# Patient Record
Sex: Male | Born: 1960 | Race: Black or African American | Hispanic: No | Marital: Married | Smoking: Former smoker
Health system: Southern US, Community
[De-identification: ages and names within clinical notes are randomized; demographics above are authoritative.]

## PROBLEM LIST (undated history)

## (undated) DIAGNOSIS — I255 Ischemic cardiomyopathy: Secondary | ICD-10-CM

## (undated) DIAGNOSIS — D649 Anemia, unspecified: Secondary | ICD-10-CM

## (undated) DIAGNOSIS — I10 Essential (primary) hypertension: Secondary | ICD-10-CM

## (undated) DIAGNOSIS — N183 Chronic kidney disease, stage 3 unspecified: Secondary | ICD-10-CM

## (undated) DIAGNOSIS — I219 Acute myocardial infarction, unspecified: Secondary | ICD-10-CM

## (undated) DIAGNOSIS — I2510 Atherosclerotic heart disease of native coronary artery without angina pectoris: Secondary | ICD-10-CM

## (undated) DIAGNOSIS — E1121 Type 2 diabetes mellitus with diabetic nephropathy: Secondary | ICD-10-CM

## (undated) DIAGNOSIS — E785 Hyperlipidemia, unspecified: Secondary | ICD-10-CM

## (undated) HISTORY — DX: Ischemic cardiomyopathy: I25.5

## (undated) HISTORY — DX: Chronic kidney disease, stage 3 unspecified: N18.30

## (undated) HISTORY — PX: BACK SURGERY: SHX140

## (undated) HISTORY — DX: Type 2 diabetes mellitus with diabetic nephropathy: E11.21

---

## 2001-02-10 ENCOUNTER — Emergency Department (HOSPITAL_COMMUNITY): Admission: EM | Admit: 2001-02-10 | Discharge: 2001-02-10 | Payer: Self-pay | Admitting: Emergency Medicine

## 2001-05-08 ENCOUNTER — Emergency Department (HOSPITAL_COMMUNITY): Admission: EM | Admit: 2001-05-08 | Discharge: 2001-05-08 | Payer: Self-pay | Admitting: Emergency Medicine

## 2001-05-08 ENCOUNTER — Encounter: Payer: Self-pay | Admitting: Emergency Medicine

## 2002-02-16 ENCOUNTER — Encounter: Payer: Self-pay | Admitting: Specialist

## 2002-02-16 ENCOUNTER — Ambulatory Visit (HOSPITAL_COMMUNITY): Admission: RE | Admit: 2002-02-16 | Discharge: 2002-02-16 | Payer: Self-pay | Admitting: Specialist

## 2007-12-25 HISTORY — PX: ROTATOR CUFF REPAIR: SHX139

## 2010-01-13 ENCOUNTER — Encounter (INDEPENDENT_AMBULATORY_CARE_PROVIDER_SITE_OTHER): Payer: Self-pay | Admitting: Internal Medicine

## 2010-01-13 ENCOUNTER — Inpatient Hospital Stay (HOSPITAL_COMMUNITY): Admission: EM | Admit: 2010-01-13 | Discharge: 2010-01-14 | Payer: Self-pay | Admitting: Emergency Medicine

## 2010-01-23 ENCOUNTER — Inpatient Hospital Stay (HOSPITAL_COMMUNITY): Admission: EM | Admit: 2010-01-23 | Discharge: 2010-01-27 | Payer: Self-pay | Admitting: Emergency Medicine

## 2010-02-21 HISTORY — PX: CORONARY ARTERY BYPASS GRAFT: SHX141

## 2010-04-28 ENCOUNTER — Inpatient Hospital Stay (HOSPITAL_COMMUNITY): Admission: EM | Admit: 2010-04-28 | Discharge: 2010-05-08 | Payer: Self-pay | Admitting: Cardiology

## 2010-04-28 ENCOUNTER — Inpatient Hospital Stay (HOSPITAL_BASED_OUTPATIENT_CLINIC_OR_DEPARTMENT_OTHER): Admission: RE | Admit: 2010-04-28 | Discharge: 2010-04-28 | Payer: Self-pay | Admitting: Cardiology

## 2010-04-28 ENCOUNTER — Ambulatory Visit: Payer: Self-pay | Admitting: Cardiothoracic Surgery

## 2010-04-29 ENCOUNTER — Encounter (INDEPENDENT_AMBULATORY_CARE_PROVIDER_SITE_OTHER): Payer: Self-pay | Admitting: Cardiology

## 2010-05-29 ENCOUNTER — Encounter: Admission: RE | Admit: 2010-05-29 | Discharge: 2010-05-29 | Payer: Self-pay | Admitting: *Deleted

## 2010-05-29 ENCOUNTER — Ambulatory Visit: Payer: Self-pay | Admitting: Cardiothoracic Surgery

## 2010-05-31 ENCOUNTER — Ambulatory Visit: Payer: Self-pay | Admitting: Cardiothoracic Surgery

## 2010-06-28 ENCOUNTER — Ambulatory Visit: Payer: Self-pay | Admitting: Cardiothoracic Surgery

## 2010-08-16 ENCOUNTER — Ambulatory Visit: Payer: Self-pay | Admitting: Cardiothoracic Surgery

## 2010-08-16 ENCOUNTER — Encounter: Admission: RE | Admit: 2010-08-16 | Discharge: 2010-08-16 | Payer: Self-pay | Admitting: Cardiothoracic Surgery

## 2010-11-01 ENCOUNTER — Ambulatory Visit: Payer: Self-pay | Admitting: Cardiothoracic Surgery

## 2010-11-15 ENCOUNTER — Encounter: Admission: RE | Admit: 2010-11-15 | Discharge: 2010-11-15 | Payer: Self-pay | Admitting: Cardiothoracic Surgery

## 2010-11-24 ENCOUNTER — Ambulatory Visit: Payer: Self-pay | Admitting: Cardiothoracic Surgery

## 2011-02-27 ENCOUNTER — Encounter (HOSPITAL_COMMUNITY)
Admission: RE | Admit: 2011-02-27 | Discharge: 2011-02-27 | Disposition: A | Discharge status: Home / Self Care | Payer: Federal, State, Local not specified - PPO | Source: Ambulatory Visit | Attending: Neurosurgery | Admitting: Neurosurgery

## 2011-02-27 DIAGNOSIS — Z01812 Encounter for preprocedural laboratory examination: Secondary | ICD-10-CM | POA: Insufficient documentation

## 2011-02-27 LAB — BASIC METABOLIC PANEL
BUN: 17 mg/dL (ref 6–23)
Calcium: 9.6 mg/dL (ref 8.4–10.5)
Chloride: 103 mEq/L (ref 96–112)
Creatinine, Ser: 1.4 mg/dL (ref 0.4–1.5)
GFR calc Af Amer: >60 mL/min (ref >60)
GFR calc non Af Amer: 54 mL/min — ABNORMAL LOW (ref >60)

## 2011-02-27 LAB — CBC
HCT: 39.1 % (ref 39.0–52.0)
MCH: 33.4 pg (ref 26.0–34.0)
Platelets: 211 10*3/uL (ref 150–400)
RDW: 13.1 % (ref 11.5–15.5)
WBC: 6.8 10*3/uL (ref 4.0–10.5)

## 2011-02-27 LAB — SURGICAL PCR SCREEN: Staphylococcus aureus: NEGATIVE

## 2011-03-01 ENCOUNTER — Inpatient Hospital Stay (HOSPITAL_COMMUNITY)
Admission: RE | Admit: 2011-03-01 | Payer: Federal, State, Local not specified - PPO | Source: Ambulatory Visit | Admitting: Neurosurgery

## 2011-03-12 LAB — POCT CARDIAC MARKERS
CKMB, poc: 5.5 ng/mL (ref 1.0–8.0)
Troponin i, poc: <0.05 ng/mL (ref 0.00–0.09)
Troponin i, poc: <0.05 ng/mL (ref 0.00–0.09)

## 2011-03-12 LAB — DIFFERENTIAL
Basophils Relative: 0 % (ref 0–1)
Basophils Relative: 0 % (ref 0–1)
Eosinophils Absolute: 0 10*3/uL (ref 0.0–0.7)
Eosinophils Absolute: 0.2 10*3/uL (ref 0.0–0.7)
Lymphs Abs: 0.8 10*3/uL (ref 0.7–4.0)
Lymphs Abs: 1.3 10*3/uL (ref 0.7–4.0)
Monocytes Absolute: 0.3 10*3/uL (ref 0.1–1.0)
Monocytes Absolute: 0.6 10*3/uL (ref 0.1–1.0)
Monocytes Relative: 10 % (ref 3–12)
Monocytes Relative: 2 % — ABNORMAL LOW (ref 3–12)
Neutrophils Relative %: 93 % — ABNORMAL HIGH (ref 43–77)

## 2011-03-12 LAB — POCT I-STAT, CHEM 8
BUN: 14 mg/dL (ref 6–23)
BUN: 9 mg/dL (ref 6–23)
Calcium, Ion: 1.16 mmol/L (ref 1.12–1.32)
Chloride: 110 mEq/L (ref 96–112)
Creatinine, Ser: 1.1 mg/dL (ref 0.4–1.5)
HCT: 49 % (ref 39.0–52.0)
Sodium: 141 mEq/L (ref 135–145)
Sodium: 142 mEq/L (ref 135–145)
TCO2: 26 mmol/L (ref 0–100)

## 2011-03-12 LAB — BASIC METABOLIC PANEL
Calcium: 9 mg/dL (ref 8.4–10.5)
GFR calc Af Amer: >60 mL/min (ref >60)
GFR calc non Af Amer: >60 mL/min (ref >60)
Glucose, Bld: 133 mg/dL — ABNORMAL HIGH (ref 70–99)
Sodium: 140 mEq/L (ref 135–145)

## 2011-03-12 LAB — URINALYSIS, ROUTINE W REFLEX MICROSCOPIC
Glucose, UA: 250 mg/dL — AB
Nitrite: NEGATIVE
Protein, ur: NEGATIVE mg/dL
Specific Gravity, Urine: 1.02 (ref 1.005–1.030)
Urobilinogen, UA: 0.2 mg/dL (ref 0.0–1.0)

## 2011-03-12 LAB — CK TOTAL AND CKMB (NOT AT ARMC)
CK, MB: 2.3 ng/mL (ref 0.3–4.0)
CK, MB: 30.5 ng/mL (ref 0.3–4.0)
Relative Index: 6.7 — ABNORMAL HIGH (ref 0.0–2.5)

## 2011-03-12 LAB — COMPREHENSIVE METABOLIC PANEL
ALT: 87 U/L — ABNORMAL HIGH (ref 0–53)
Alkaline Phosphatase: 57 U/L (ref 39–117)
CO2: 22 mEq/L (ref 19–32)
GFR calc non Af Amer: >60 mL/min (ref >60)
Glucose, Bld: 74 mg/dL (ref 70–99)
Potassium: 4.4 mEq/L (ref 3.5–5.1)
Sodium: 139 mEq/L (ref 135–145)
Total Protein: 7.6 g/dL (ref 6.0–8.3)

## 2011-03-12 LAB — CBC
HCT: 40.1 % (ref 39.0–52.0)
HCT: 45.2 % (ref 39.0–52.0)
Hemoglobin: 15.2 g/dL (ref 13.0–17.0)
MCHC: 33.6 g/dL (ref 30.0–36.0)
MCHC: 34.3 g/dL (ref 30.0–36.0)
MCV: 98 fL (ref 78.0–100.0)
MCV: 99.6 fL (ref 78.0–100.0)
MCV: 97.7 fL (ref 78.0–100.0)
Platelets: 178 10*3/uL (ref 150–400)
RBC: 3.02 MIL/uL — ABNORMAL LOW (ref 4.22–5.81)
WBC: 14.8 10*3/uL — ABNORMAL HIGH (ref 4.0–10.5)
WBC: 11.7 10*3/uL — ABNORMAL HIGH (ref 4.0–10.5)

## 2011-03-12 LAB — TSH: TSH: 0.584 u[IU]/mL (ref 0.350–4.500)

## 2011-03-12 LAB — HEMOGLOBIN A1C: Hgb A1c MFr Bld: 6.6 % — ABNORMAL HIGH (ref 4.6–6.1)

## 2011-03-12 LAB — CARDIAC PANEL(CRET KIN+CKTOT+MB+TROPI)
CK, MB: 2.8 ng/mL (ref 0.3–4.0)
Relative Index: 1.5 (ref 0.0–2.5)
Total CK: 103 U/L (ref 7–232)
Total CK: 311 U/L — ABNORMAL HIGH (ref 7–232)

## 2011-03-12 LAB — LIPID PANEL
Cholesterol: 175 mg/dL (ref 0–200)
LDL Cholesterol: 114 mg/dL — ABNORMAL HIGH (ref 0–99)
Total CHOL/HDL Ratio: 6.7 RATIO

## 2011-03-12 LAB — TROPONIN I: Troponin I: 0.05 ng/mL (ref 0.00–0.06)

## 2011-03-12 LAB — PROTIME-INR: INR: 0.98 (ref 0.00–1.49)

## 2011-03-13 LAB — BASIC METABOLIC PANEL
BUN: 10 mg/dL (ref 6–23)
BUN: 9 mg/dL (ref 6–23)
BUN: 9 mg/dL (ref 6–23)
CO2: 22 mEq/L (ref 19–32)
CO2: 27 mEq/L (ref 19–32)
Calcium: 7.6 mg/dL — ABNORMAL LOW (ref 8.4–10.5)
Calcium: 8.2 mg/dL — ABNORMAL LOW (ref 8.4–10.5)
Chloride: 107 mEq/L (ref 96–112)
Chloride: 107 mEq/L (ref 96–112)
Chloride: 115 mEq/L — ABNORMAL HIGH (ref 96–112)
Creatinine, Ser: 1.06 mg/dL (ref 0.4–1.5)
Creatinine, Ser: 1.09 mg/dL (ref 0.4–1.5)
GFR calc Af Amer: >60 mL/min (ref >60)
GFR calc Af Amer: >60 mL/min (ref >60)
GFR calc non Af Amer: >60 mL/min (ref >60)
GFR calc non Af Amer: >60 mL/min (ref >60)
GFR calc non Af Amer: >60 mL/min (ref >60)
Glucose, Bld: 119 mg/dL — ABNORMAL HIGH (ref 70–99)
Glucose, Bld: 139 mg/dL — ABNORMAL HIGH (ref 70–99)
Glucose, Bld: 84 mg/dL (ref 70–99)
Potassium: 3.9 mEq/L (ref 3.5–5.1)
Potassium: 3.9 mEq/L (ref 3.5–5.1)
Potassium: 4 mEq/L (ref 3.5–5.1)
Sodium: 138 mEq/L (ref 135–145)
Sodium: 138 mEq/L (ref 135–145)
Sodium: 142 mEq/L (ref 135–145)

## 2011-03-13 LAB — PROTIME-INR
INR: 1.22 (ref 0.00–1.49)
Prothrombin Time: 15.3 seconds — ABNORMAL HIGH (ref 11.6–15.2)

## 2011-03-13 LAB — CBC
HCT: 30.4 % — ABNORMAL LOW (ref 39.0–52.0)
HCT: 32.6 % — ABNORMAL LOW (ref 39.0–52.0)
HCT: 33.2 % — ABNORMAL LOW (ref 39.0–52.0)
HCT: 36.9 % — ABNORMAL LOW (ref 39.0–52.0)
HCT: 37.7 % — ABNORMAL LOW (ref 39.0–52.0)
HCT: 38.5 % — ABNORMAL LOW (ref 39.0–52.0)
HCT: 38.6 % — ABNORMAL LOW (ref 39.0–52.0)
HCT: 38.8 % — ABNORMAL LOW (ref 39.0–52.0)
HCT: 40.2 % (ref 39.0–52.0)
Hemoglobin: 10.4 g/dL — ABNORMAL LOW (ref 13.0–17.0)
Hemoglobin: 11.1 g/dL — ABNORMAL LOW (ref 13.0–17.0)
Hemoglobin: 11.4 g/dL — ABNORMAL LOW (ref 13.0–17.0)
Hemoglobin: 12.5 g/dL — ABNORMAL LOW (ref 13.0–17.0)
Hemoglobin: 12.9 g/dL — ABNORMAL LOW (ref 13.0–17.0)
Hemoglobin: 13.1 g/dL (ref 13.0–17.0)
Hemoglobin: 13.2 g/dL (ref 13.0–17.0)
Hemoglobin: 13.3 g/dL (ref 13.0–17.0)
Hemoglobin: 13.6 g/dL (ref 13.0–17.0)
Hemoglobin: 14.5 g/dL (ref 13.0–17.0)
MCHC: 33.6 g/dL (ref 30.0–36.0)
MCHC: 33.6 g/dL (ref 30.0–36.0)
MCHC: 33.7 g/dL (ref 30.0–36.0)
MCHC: 33.9 g/dL (ref 30.0–36.0)
MCHC: 34 g/dL (ref 30.0–36.0)
MCHC: 34.1 g/dL (ref 30.0–36.0)
MCHC: 34.2 g/dL (ref 30.0–36.0)
MCHC: 34.2 g/dL (ref 30.0–36.0)
MCHC: 34.5 g/dL (ref 30.0–36.0)
MCHC: 34.5 g/dL (ref 30.0–36.0)
MCV: 97.1 fL (ref 78.0–100.0)
MCV: 97.1 fL (ref 78.0–100.0)
MCV: 97.6 fL (ref 78.0–100.0)
MCV: 97.6 fL (ref 78.0–100.0)
MCV: 97.8 fL (ref 78.0–100.0)
MCV: 97.8 fL (ref 78.0–100.0)
MCV: 97.9 fL (ref 78.0–100.0)
MCV: 98.3 fL (ref 78.0–100.0)
MCV: 98.8 fL (ref 78.0–100.0)
MCV: 98.9 fL (ref 78.0–100.0)
Platelets: 139 10*3/uL — ABNORMAL LOW (ref 150–400)
Platelets: 144 10*3/uL — ABNORMAL LOW (ref 150–400)
Platelets: 145 10*3/uL — ABNORMAL LOW (ref 150–400)
Platelets: 145 10*3/uL — ABNORMAL LOW (ref 150–400)
Platelets: 146 10*3/uL — ABNORMAL LOW (ref 150–400)
Platelets: 79 10*3/uL — ABNORMAL LOW (ref 150–400)
Platelets: 84 10*3/uL — ABNORMAL LOW (ref 150–400)
Platelets: 85 10*3/uL — ABNORMAL LOW (ref 150–400)
Platelets: 89 10*3/uL — ABNORMAL LOW (ref 150–400)
Platelets: 94 10*3/uL — ABNORMAL LOW (ref 150–400)
RBC: 3.11 MIL/uL — ABNORMAL LOW (ref 4.22–5.81)
RBC: 3.29 MIL/uL — ABNORMAL LOW (ref 4.22–5.81)
RBC: 3.4 MIL/uL — ABNORMAL LOW (ref 4.22–5.81)
RBC: 3.73 MIL/uL — ABNORMAL LOW (ref 4.22–5.81)
RBC: 3.92 MIL/uL — ABNORMAL LOW (ref 4.22–5.81)
RBC: 3.95 MIL/uL — ABNORMAL LOW (ref 4.22–5.81)
RBC: 4.32 MIL/uL (ref 4.22–5.81)
RDW: 13.2 % (ref 11.5–15.5)
RDW: 13.3 % (ref 11.5–15.5)
RDW: 13.3 % (ref 11.5–15.5)
RDW: 13.3 % (ref 11.5–15.5)
RDW: 13.4 % (ref 11.5–15.5)
RDW: 13.4 % (ref 11.5–15.5)
RDW: 13.5 % (ref 11.5–15.5)
RDW: 13.6 % (ref 11.5–15.5)
RDW: 13.6 % (ref 11.5–15.5)
RDW: 13.7 % (ref 11.5–15.5)
WBC: 10.3 10*3/uL (ref 4.0–10.5)
WBC: 11.1 10*3/uL — ABNORMAL HIGH (ref 4.0–10.5)
WBC: 11.4 10*3/uL — ABNORMAL HIGH (ref 4.0–10.5)
WBC: 12.6 10*3/uL — ABNORMAL HIGH (ref 4.0–10.5)
WBC: 5.1 10*3/uL (ref 4.0–10.5)
WBC: 6.9 10*3/uL (ref 4.0–10.5)
WBC: 9.7 10*3/uL (ref 4.0–10.5)

## 2011-03-13 LAB — CREATININE, SERUM
Creatinine, Ser: 1.01 mg/dL (ref 0.4–1.5)
Creatinine, Ser: 1.26 mg/dL (ref 0.4–1.5)
GFR calc Af Amer: >60 mL/min (ref >60)
GFR calc Af Amer: >60 mL/min (ref >60)
GFR calc non Af Amer: >60 mL/min (ref >60)
GFR calc non Af Amer: >60 mL/min (ref >60)

## 2011-03-13 LAB — POCT I-STAT 4, (NA,K, GLUC, HGB,HCT)
Glucose, Bld: 113 mg/dL — ABNORMAL HIGH (ref 70–99)
Glucose, Bld: 118 mg/dL — ABNORMAL HIGH (ref 70–99)
Glucose, Bld: 88 mg/dL (ref 70–99)
HCT: 28 % — ABNORMAL LOW (ref 39.0–52.0)
HCT: 31 % — ABNORMAL LOW (ref 39.0–52.0)
HCT: 36 % — ABNORMAL LOW (ref 39.0–52.0)
Hemoglobin: 10.9 g/dL — ABNORMAL LOW (ref 13.0–17.0)
Hemoglobin: 12.2 g/dL — ABNORMAL LOW (ref 13.0–17.0)
Hemoglobin: 13.3 g/dL (ref 13.0–17.0)
Hemoglobin: 9.5 g/dL — ABNORMAL LOW (ref 13.0–17.0)
Potassium: 4 mEq/L (ref 3.5–5.1)
Potassium: 4.4 mEq/L (ref 3.5–5.1)
Potassium: 5.3 mEq/L — ABNORMAL HIGH (ref 3.5–5.1)
Potassium: 5.7 mEq/L — ABNORMAL HIGH (ref 3.5–5.1)
Sodium: 134 mEq/L — ABNORMAL LOW (ref 135–145)
Sodium: 138 mEq/L (ref 135–145)
Sodium: 139 mEq/L (ref 135–145)

## 2011-03-13 LAB — GLUCOSE, CAPILLARY
Glucose-Capillary: 127 mg/dL — ABNORMAL HIGH (ref 70–99)
Glucose-Capillary: 130 mg/dL — ABNORMAL HIGH (ref 70–99)
Glucose-Capillary: 132 mg/dL — ABNORMAL HIGH (ref 70–99)
Glucose-Capillary: 152 mg/dL — ABNORMAL HIGH (ref 70–99)
Glucose-Capillary: 99 mg/dL (ref 70–99)

## 2011-03-13 LAB — BLOOD GAS, ARTERIAL
FIO2: 0.21 %
O2 Saturation: 94.4 %
Patient temperature: 98.6
pH, Arterial: 7.415 (ref 7.350–7.450)

## 2011-03-13 LAB — URINE CULTURE
Colony Count: NO GROWTH
Culture: NO GROWTH
Special Requests: NEGATIVE

## 2011-03-13 LAB — POCT I-STAT 3, ART BLOOD GAS (G3+)
Acid-base deficit: 2 mmol/L (ref 0.0–2.0)
Acid-base deficit: 4 mmol/L — ABNORMAL HIGH (ref 0.0–2.0)
Acid-base deficit: 4 mmol/L — ABNORMAL HIGH (ref 0.0–2.0)
Bicarbonate: 20.8 mEq/L (ref 20.0–24.0)
Bicarbonate: 21.2 mEq/L (ref 20.0–24.0)
Bicarbonate: 22.6 mEq/L (ref 20.0–24.0)
Bicarbonate: 23.3 mEq/L (ref 20.0–24.0)
O2 Saturation: 96 %
Patient temperature: 38.6
Patient temperature: 39.1
TCO2: 22 mmol/L (ref 0–100)
TCO2: 24 mmol/L (ref 0–100)
pCO2 arterial: 39.5 mmHg (ref 35.0–45.0)
pCO2 arterial: 40.9 mmHg (ref 35.0–45.0)
pCO2 arterial: 42.5 mmHg (ref 35.0–45.0)
pH, Arterial: 7.31 — ABNORMAL LOW (ref 7.350–7.450)
pH, Arterial: 7.327 — ABNORMAL LOW (ref 7.350–7.450)
pH, Arterial: 7.334 — ABNORMAL LOW (ref 7.350–7.450)
pH, Arterial: 7.38 (ref 7.350–7.450)
pO2, Arterial: 100 mmHg (ref 80.0–100.0)
pO2, Arterial: 149 mmHg — ABNORMAL HIGH (ref 80.0–100.0)
pO2, Arterial: 247 mmHg — ABNORMAL HIGH (ref 80.0–100.0)

## 2011-03-13 LAB — URINALYSIS, MICROSCOPIC ONLY
Glucose, UA: NEGATIVE mg/dL
Hgb urine dipstick: NEGATIVE
Ketones, ur: NEGATIVE mg/dL
Protein, ur: NEGATIVE mg/dL
Urobilinogen, UA: 1 mg/dL (ref 0.0–1.0)

## 2011-03-13 LAB — CROSSMATCH

## 2011-03-13 LAB — URINALYSIS, ROUTINE W REFLEX MICROSCOPIC
Bilirubin Urine: NEGATIVE
Glucose, UA: NEGATIVE mg/dL
Hgb urine dipstick: NEGATIVE
Ketones, ur: NEGATIVE mg/dL
Nitrite: NEGATIVE
Protein, ur: NEGATIVE mg/dL
Specific Gravity, Urine: 1.035 — ABNORMAL HIGH (ref 1.005–1.030)
Urobilinogen, UA: 0.2 mg/dL (ref 0.0–1.0)
pH: 5 (ref 5.0–8.0)

## 2011-03-13 LAB — HEMOGLOBIN AND HEMATOCRIT, BLOOD
HCT: 30.4 % — ABNORMAL LOW (ref 39.0–52.0)
Hemoglobin: 10.6 g/dL — ABNORMAL LOW (ref 13.0–17.0)

## 2011-03-13 LAB — POCT I-STAT, CHEM 8
BUN: 10 mg/dL (ref 6–23)
Chloride: 113 mEq/L — ABNORMAL HIGH (ref 96–112)
Creatinine, Ser: 1 mg/dL (ref 0.4–1.5)
Hemoglobin: 13.3 g/dL (ref 13.0–17.0)
Potassium: 4.5 mEq/L (ref 3.5–5.1)
Sodium: 143 mEq/L (ref 135–145)

## 2011-03-13 LAB — COMPREHENSIVE METABOLIC PANEL
Alkaline Phosphatase: 51 U/L (ref 39–117)
BUN: 11 mg/dL (ref 6–23)
CO2: 24 mEq/L (ref 19–32)
Chloride: 108 mEq/L (ref 96–112)
Creatinine, Ser: 1.02 mg/dL (ref 0.4–1.5)
GFR calc non Af Amer: >60 mL/min (ref >60)
Glucose, Bld: 140 mg/dL — ABNORMAL HIGH (ref 70–99)
Total Bilirubin: 0.6 mg/dL (ref 0.3–1.2)

## 2011-03-13 LAB — HEPARIN LEVEL (UNFRACTIONATED): Heparin Unfractionated: 0.4 IU/mL (ref 0.30–0.70)

## 2011-03-13 LAB — POCT I-STAT GLUCOSE
Glucose, Bld: 90 mg/dL (ref 70–99)
Operator id: 198871

## 2011-03-13 LAB — MAGNESIUM
Magnesium: 2.2 mg/dL (ref 1.5–2.5)
Magnesium: 2.3 mg/dL (ref 1.5–2.5)
Magnesium: 3.1 mg/dL — ABNORMAL HIGH (ref 1.5–2.5)

## 2011-03-13 LAB — ABO/RH: ABO/RH(D): O POS

## 2011-03-13 LAB — APTT
aPTT: 33 seconds (ref 24–37)
aPTT: 66 seconds — ABNORMAL HIGH (ref 24–37)

## 2011-03-15 LAB — CBC
Hemoglobin: 12.1 g/dL — ABNORMAL LOW (ref 13.0–17.0)
Hemoglobin: 12.2 g/dL — ABNORMAL LOW (ref 13.0–17.0)
MCHC: 33.5 g/dL (ref 30.0–36.0)
MCHC: 34.2 g/dL (ref 30.0–36.0)
MCHC: 34.3 g/dL (ref 30.0–36.0)
MCV: 98 fL (ref 78.0–100.0)
MCV: 99 fL (ref 78.0–100.0)
MCV: 99.4 fL (ref 78.0–100.0)
Platelets: 164 10*3/uL (ref 150–400)
RBC: 3.56 MIL/uL — ABNORMAL LOW (ref 4.22–5.81)
RBC: 3.58 MIL/uL — ABNORMAL LOW (ref 4.22–5.81)
RBC: 3.74 MIL/uL — ABNORMAL LOW (ref 4.22–5.81)
RDW: 13.3 % (ref 11.5–15.5)
WBC: 8.9 10*3/uL (ref 4.0–10.5)
WBC: 9.2 10*3/uL (ref 4.0–10.5)

## 2011-03-15 LAB — LIPID PANEL
HDL: 30 mg/dL — ABNORMAL LOW (ref >39)
Total CHOL/HDL Ratio: 4.7 RATIO
VLDL: 17 mg/dL (ref 0–40)

## 2011-03-15 LAB — BASIC METABOLIC PANEL
BUN: 15 mg/dL (ref 6–23)
CO2: 23 mEq/L (ref 19–32)
Calcium: 8.4 mg/dL (ref 8.4–10.5)
Chloride: 108 mEq/L (ref 96–112)
Creatinine, Ser: 1.25 mg/dL (ref 0.4–1.5)
Glucose, Bld: 99 mg/dL (ref 70–99)

## 2011-03-15 LAB — CARDIAC PANEL(CRET KIN+CKTOT+MB+TROPI)
CK, MB: 131.9 ng/mL (ref 0.3–4.0)
Total CK: 1538 U/L — ABNORMAL HIGH (ref 7–232)

## 2011-04-13 ENCOUNTER — Encounter (HOSPITAL_COMMUNITY)
Admission: RE | Admit: 2011-04-13 | Discharge: 2011-04-13 | Disposition: A | Discharge status: Home / Self Care | Payer: Federal, State, Local not specified - PPO | Source: Ambulatory Visit | Attending: Neurosurgery | Admitting: Neurosurgery

## 2011-04-13 LAB — BASIC METABOLIC PANEL
Chloride: 107 mEq/L (ref 96–112)
GFR calc non Af Amer: >60 mL/min (ref >60)
Potassium: 4.5 mEq/L (ref 3.5–5.1)
Sodium: 136 mEq/L (ref 135–145)

## 2011-04-13 LAB — TYPE AND SCREEN
ABO/RH(D): O POS
Antibody Screen: NEGATIVE

## 2011-04-13 LAB — CBC
MCV: 93.7 fL (ref 78.0–100.0)
Platelets: 179 10*3/uL (ref 150–400)
RBC: 4.26 MIL/uL (ref 4.22–5.81)
RDW: 13.5 % (ref 11.5–15.5)
WBC: 6.4 10*3/uL (ref 4.0–10.5)

## 2011-04-13 LAB — SURGICAL PCR SCREEN
MRSA, PCR: NEGATIVE
Staphylococcus aureus: NEGATIVE

## 2011-04-19 ENCOUNTER — Inpatient Hospital Stay (HOSPITAL_COMMUNITY)
Admission: RE | Admit: 2011-04-19 | Discharge: 2011-04-22 | DRG: 756 | Disposition: A | Discharge status: Home / Self Care | Payer: Federal, State, Local not specified - PPO | Source: Ambulatory Visit | Attending: Neurosurgery | Admitting: Neurosurgery

## 2011-04-19 ENCOUNTER — Inpatient Hospital Stay (HOSPITAL_COMMUNITY): Payer: Federal, State, Local not specified - PPO

## 2011-04-19 DIAGNOSIS — Z01812 Encounter for preprocedural laboratory examination: Secondary | ICD-10-CM

## 2011-04-19 DIAGNOSIS — I10 Essential (primary) hypertension: Secondary | ICD-10-CM | POA: Diagnosis present

## 2011-04-19 DIAGNOSIS — Z951 Presence of aortocoronary bypass graft: Secondary | ICD-10-CM

## 2011-04-19 DIAGNOSIS — M5126 Other intervertebral disc displacement, lumbar region: Secondary | ICD-10-CM | POA: Diagnosis present

## 2011-04-19 DIAGNOSIS — M47817 Spondylosis without myelopathy or radiculopathy, lumbosacral region: Principal | ICD-10-CM | POA: Diagnosis present

## 2011-04-19 DIAGNOSIS — Z01810 Encounter for preprocedural cardiovascular examination: Secondary | ICD-10-CM

## 2011-04-19 DIAGNOSIS — I2510 Atherosclerotic heart disease of native coronary artery without angina pectoris: Secondary | ICD-10-CM | POA: Diagnosis present

## 2011-04-25 NOTE — Op Note (Signed)
NAMEZACKARIAH, VANDERPOL                  ACCOUNT NO.:  [id number]  MEDICAL RECORD NO.:  [id number]           PATIENT TYPE:  I  LOCATION:  3008                         FACILITY:  [facility]  PHYSICIAN:  Danae Orleans. Venetia Maxon, M.D.  DATE OF BIRTH:  1961/12/07  DATE OF PROCEDURE:  04/19/2011 DATE OF DISCHARGE:                              OPERATIVE REPORT   PREOPERATIVE DIAGNOSIS:  Herniated lumbar disk with stenosis, spondylosis, degenerative disk disease and radiculopathy L4-5, L5-S1 levels.  POSTOPERATIVE DIAGNOSIS:  Herniated lumbar disk with stenosis, spondylosis, degenerative disk disease and radiculopathy L4-5, L5-S1 levels.  PROCEDURE: 1. L4-5 and L5-S1 decompression and more involved than for simple     interbody fusion. 2. Transforaminal lumbar interbody fusion with PEEK interbody cages,     morselized bone autograft, PuraGen and allograft at the L4-5, L5-S1     levels. 3. Segmental instrumentation using pedicle screw fixation L4-S1 levels bilaterally. 4. Posterolateral arthrodesis L4-S1 levels.  SURGEON:  Danae Orleans. Venetia Maxon, MD  ASSISTANTS:  Georgiann Cocker, RN and Hilda Lias, MD  ANESTHESIA:  General endotracheal anesthesia.  ESTIMATED BLOOD LOSS:  200 mL.  COMPLICATIONS:  None.  DISPOSITION:  Recovery.  INDICATIONS:  Kwesi Sangha is a 50 year old man with severe lumbar spondylosis and marked foraminal stenosis of the L5-S1 level on the left and with L4-5 degeneration and spondylosis and stenosis with herniated disk also affecting the left-sided nerve root.  It was elected to take him to surgery for decompression and fusion at these affected levels.  PROCEDURE:  Mr. Keimig was brought to the operating room.  Following a satisfactory and uncomplicated induction of general endotracheal anesthesia plus intravenous lines, the patient was placed in a prone position on the Banning table.  His low back was shaved, then prepped and draped in usual sterile fashion.  Area of planned  incision was infiltrated with local lidocaine.  Incision was made in the midline and carried through to the lumbodorsal fascia which was incised bilaterally. Subperiosteal dissection was performed exposing L4-S1 transverse processes and sacral ala.  Self-retaining retractors were placed to facilitate exposure.  Intraoperative x-ray demonstrated marker probes at the L4, L5 and S1 levels.  A hemi laminectomy at L5-L4 was then performed on the left side of midline with thorough decompression of the L4, L5 and S1 nerve roots.  There was a large calcified disk herniation which was causing severe foraminal stenosis on the left and this was carefully removed and along with a large osteophyte the interspace was thoroughly decompressed and the L5 nerve root was decompressed as it extended out the neural foramen.  Similar decompression was then performed at L4-5 level.  There was also a calcified disk herniation at this level again with significant foraminal stenosis and thorough decompression was performed.  Contralateral distraction was then performed on the right with laminar spreaders, thorough endplate preparation was performed and at the L4-5 level 8 mL of bone autograft which had been moselized through a bone mill was tamped into the interspace and countersunk appropriately followed by an 11-mm PEEK interbody cage which was packed with PuraGen soaked in ProFuse blocks inserted and  countersunk appropriately.  Additional bone autograft was placed overlying the implant and tamped into position.  At the L5-S1 level, a similar decompression and preparation was performed.  There did not appear to be much distraction possible at this level and after trial sizing, a 6-mm TLIF cage was packed with ProFuse and additional approximately 4 mL of bone autograft was placed in the interspace and countersunk appropriately and the TLIF implant was then placed and countersunk.  Following this, pedicle screws  were placed using 45 x 6.5 mm screws at S1, 50 x 6.5-mm screws at L4-L5 bilaterally.  60-mm preloaded rod was fixed in the screw heads on the left and locked down in situ and 70-mm rod was placed on the right.  Prior to placing the final screws, thorough decortication was performed at the facet joints and facet joint complexes on posterolateral region on the right.  The remaining approximately 8 mL of bone autograft was placed and countersunk and tamped into position overlying the facet joint complexes in the posterolateral region and 10 mL of Vitoss bone which had been mixed with the patient's blood was then placed overlying this and tamped into position.  After the screws were locked down in situ, the self- retaining retractor was removed.  Neural elements were palpated and felt to be well-decompressed.  Hemostasis was assured.  The lumbodorsal fascia was closed with 1 Vicryl sutures, subcutaneous tissues were reapproximated with 2-0 Vicryl interrupted inverted sutures and skin edges were reapproximated with 3-0 Vicryl subcuticular stitch.  Wound was dressed with Benzoin, Steri-Strips, Telfa gauze and tape.  The patient was extubated in the operating room and taken to the recovery in stable and satisfactory condition having tolerated his operation.  All counts were correct at the end of this case.     Danae Orleans. Venetia Maxon, M.D.     JDS/MEDQ  D:  04/19/2011  T:  04/20/2011  Job:  [id number]  Electronically Signed by Maeola Harman M.D. on 04/25/2011 08:14:30 AM

## 2011-05-08 NOTE — Assessment & Plan Note (Signed)
OFFICE VISIT   Rodney Gregory, Rodney Gregory  DOB:  [birth year]                                        May 29, 2010  CHART #:  [id number]   REASON FOR OFFICE VISIT:  Routine followup status post CABG x3.   HISTORY OF PRESENT ILLNESS:  This is a 50 year old African American male  with a previous anterior myocardial infarction in January 2011 (status  post PCI with bare-metal stent), who had recurrent angina and had a  coronary artery bypass grafting surgery x3 on May 04, 2010.  The patient  states that since having been discharged from the hospital he has  complaints of easy fatigability, weakness, and occasional dizziness.  He  denies any chest pain or shortness of breath.   PHYSICAL EXAMINATION:  General:  This is a pleasant 50 year old [place]  American male who is in no acute distress who is alert, oriented, and  cooperative and accompanied by his wife.  Vital Signs:  BP 130/87, heart  rate 61, respiration 18, O2 sat 96% on room air.  Cardiovascular:  Slightly bradycardic.  No murmurs, gallops, or rubs.  Pulmonary:  Clear  to auscultation bilaterally.  No rales, wheezes, or rhonchi.  Abdomen:  Soft, nontender.  Bowel sounds present.  Sternum is solid.  Sternal  wound is clean, dry, and well healed.  Chest tube suture sites well  healed.  Right lower extremity wound well healed.  No sign of infection.  Slight firmness to right medial thigh (approximately EVH site).   DIAGNOSTIC TESTS:  Chest x-ray showed no pneumothorax.  Left lung clear.  Diffuse peribronchial thickening.  Mild cardiomegaly.   IMPRESSION AND PLAN:  Overall, the patient is continuing to make steady  progress status post coronary artery bypass graft x3 on May 04, 2010.  It is felt that his complaints may be secondary to the dosage of his  Coreg which is currently 25 mg p.o. 2 times daily.  We are going to  decrease his Coreg to 12.5 mg p.o. 2 times daily, and the patient is to  continue with his ramipril  10 mg p.o. daily (as taken previously).  1. The patient was instructed he needs to contact Dr. Norris Cross office      for a followup appointment as soon as possible.  The patient later      stated that he desires to be seen and followed by Dr. [name] and is      going to contact the office to arrange a followup appointment.  It      should also be noted that we have left a message with their office      as well.  2. The patient was instructed he may begin driving short distances 30      minutes or less during the day.  He may gradually increase his      frequency and duration as tolerates over the next couple of weeks.  3. The patient instructed to continue with sternal precautions, i.e.,      no lifting more than 10 pounds for at least 2-4 more weeks.  4. He is going to follow up with Dr. Donata Clay in 1-2 weeks.  The      patient was instructed to contact the office if there is any      further problems or questions in  the interim.  5. The patient was instructed to follow up with Dr. Clarene Duke who is his      primary care physician.  The patient did have acute blood loss      anemia in the hospital.  He was started to finish his iron and not      obtain any refills.  He was instructed to obtain a CBC with Dr.      Fredirick Maudlin office to make sure his anemia has improved.  6. The patient was also questioned about smoking cessation.  He      continues to be smoke-free since January.   Kerin Perna, M.D.  Electronically Signed   DZ/MEDQ  D:  05/29/2010  T:  05/30/2010  Job:  [id number]   cc:   Armanda Magic, M.D.  [name], MD

## 2011-05-08 NOTE — Assessment & Plan Note (Signed)
OFFICE VISIT   Rodney Gregory, Rodney Gregory  DOB:  [birth year]                                        August 16, 2010  CHART #:  [id number]   CURRENT PROBLEMS:  Status post coronary artery bypass graft x3 on May 04, 2010, for class IV unstable angina, severe two-vessel coronary  disease.   PRESENT ILLNESS:  The patient returns for discussion of return to work,  now 3 months after surgery.  He is walking and has no angina.  He still  has muscle chest wall soreness, doing such things as stretching his arms  out, pushing a lawn mower, or rolling over in bed.  He is not taking the  prescription pain medication.  He takes an occasional Tylenol.  Otherwise, he is doing well and he continues on his aspirin, Crestor,  ramipril, and vitamins.   PHYSICAL EXAMINATION:  Vital Signs:  Blood pressure 110/80, pulse 65,  respirations 18, and saturation 97.  General:  He is anxious, but alert  and oriented.  Lungs:  Breath sounds are clear.  The sternum is well  healed and stable.  There is some tenderness along the right sternal  border.  Extremities:  He has good range of motion of his upper  extremities.  Good hand grip bilaterally and positive pulses in all  extremities.  The leg incision is healed well and there is no ankle  edema.   DIAGNOSTIC TESTS:  A PA and lateral chest x-ray reveals clear lung  fields, stable cardiac silhouette, sternal wires are all intact, and  there is no pleural effusion.   IMPRESSION:  After a long discussion with the patient, it is apparent he  still has some muscle soreness from the chest incision and the  sternotomy retraction.  He is a fairly muscular male and is having  prolonged chest wall symptoms.  I suggested that he take daily doses of  an Advil in the morning and a Tylenol at night that would make him more  comfortable.  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  No prescriptions were provided and the patient will  return in approximately 3 months for a followup.   Kerin Perna, M.D.  Electronically Signed   PV/MEDQ  D:  08/16/2010  T:  08/17/2010  Job:  [id number]   cc:   Caryn Bee L. Little, M.D.  [name], M.D.

## 2011-05-08 NOTE — Assessment & Plan Note (Signed)
OFFICE VISIT   Rodney Gregory, Rodney Gregory  DOB:  [birth year]                                        November 24, 2010  CHART #:  [id number]   CURRENT PROBLEMS:  1. Chest wall pain on the left side following multivessel bypass      grafting in May 2011.  2. Hypertension.  3. Reformed smoker.   PRESENT ILLNESS:  The patient returns for further evaluation and follow  up of his left chest wall pain.  This is described as sharp pain  whenever he takes a deep breath or reaches above his head with his  hands.  He has tenderness to the left upper anterior chest wall and mid  clavicular line and is very sensitive.  Due to concern over possible  recurrent angina and ischemia, a stress Cardiolite was performed by Dr.  [name].  This shows no evidence of ischemia.  He does have an old scar in  his anterior wall from an old MI.  The results of this study give no  indication that there is a problem with the bypass grafts.   He is not taking narcotics for the chest pain but was recently started  on prednisone and Hydrocortone by his primary care physician for  possible herniated lumbar disk.   PHYSICAL EXAMINATION:  Blood pressure 130/85, pulse 84, respirations 18,  saturation 94% on room air.  He is alert and anxious.  Breath sounds are  clear.  Cardiac rhythm is regular.  The sternum is well healed.  There  is tenderness over the left upper chest in the midclavicular line.  There is no deformity.  The surgical incisions in the chest and leg were  all well healed.   IMPRESSION AND PLAN:  Chest wall pain, musculoskeletal versus neuritis.  We referred to the pain clinic and I will see him back here as needed.   Kerin Perna, M.D.  Electronically Signed   PV/MEDQ  D:  11/24/2010  T:  11/25/2010  Job:  [id number]

## 2011-05-08 NOTE — Assessment & Plan Note (Signed)
OFFICE VISIT   Rodney Gregory, Rodney Gregory  DOB:  Jun 27, 1961                                        November 01, 2010  CHART #:  [id number]   CURRENT PROBLEMS:  1. Chronic left anterior chest wall pain and tenderness 6 months after      multivessel bypass grafting (coronary artery bypass graft x3 on May 04, 2010).  2. Hypertension.  3. Dyslipidemia.  4. Reformed smoker.   PRESENT ILLNESS:  The patient returns for a 62-month follow up to discuss  his chest wall pain after multivessel bypass grafting in May, at which  time he presented with exertional chest pain with a history of a  previous LAD stent.  Cardiac cath demonstrated recurrent coronary  disease and he underwent surgery with grafts to the LAD (LIMA) and vein  grafts to the diagonal and OM.  His postoperative course was  uncomplicated.  However, on his return office visit he has had  persistent chest pain and soreness which has prevented him from  stretching his arms, pushing a lawnmower, rolling in bed.  He is only  taking over-the-counter pain medicine or anti-inflammatory agents.  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  He has not  smoked.  He is not currently in an exercise program, but he does note  dyspnea on exertion which he feels is getting worse.  He has no typical  anginal pain but does have persistent tenderness along the left mid  sternum which is aggravated by lifting or pushing anything.  He also  complains of some intermittent smelling around the right knee.  His  surgical incisions have healed well, and there is no evidence of sternal  instability on exam or chest x-ray.  He continues his medications  including Crestor, aspirin, Benicar, and vitamins.   PHYSICAL EXAMINATION:  Blood pressure 145/90, pulse 72 and regular,  respirations 18, saturation on room air 96%.  He is anxious and  frustrated.  Breath sounds are with scattered rhonchi.  Cardiac exam is  regular.   The sternum is well healed, but there is intense tenderness at  the left mid parasternal area.  There is no rub or gallop.  He has no  notable swelling of his legs and positive pulses in both ankles.  He has  good arm and hand grip strength bilaterally.   IMPRESSION AND PLAN:  Now 6 months after surgery, the patient still has  considerable chest wall tenderness.  A CT scan of the chest will be  performed to evaluate the chest wall anatomy and structure.  He may need  further evaluation by obtaining a clinical pain specialist evaluation.   In regards to his dyspnea on exertion, I will ask him to be reviewed by  Dr. [name] for probable repeat stress Cardiolite scan.  The patient will  be given another month extension on return to work and return to see me  in approximately 2 weeks. If the cardiolite is negative then consider  pain clinic referral.   Kerin Perna, M.D.  Electronically Signed   PV/MEDQ  D:  11/01/2010  T:  11/01/2010  Job:  [id number]   cc:   [name], M.D.

## 2011-05-08 NOTE — Assessment & Plan Note (Signed)
OFFICE VISIT   CHISUM, HABENICHT  DOB:  03-Jun-1961                                        June 28, 2010  CHART #:  [id number]   CURRENT PROBLEMS:  1. Coronary artery bypass graft x3 for unstable angina on May 04, 2010, using left internal mammary artery graft to the left anterior      descending and vein grafts to the circumflex, marginal, and first      diagonal.  2. Reformed smoker.  3. Hypertension.  4. Dyslipidemia.   PRESENT ILLNESS:  The patient is a very nice 50 year old male who  returns for his first postop visit 8 weeks following multivessel bypass  grafting.  He has done well at home without recurrent angina, and the  surgical incisions are healing well.  He still has some sternal soreness  due to his young age and thoracic muscle mass.  He is walking 30 minutes  daily.  He is not smoking and he is continuing his discharge medications  including aspirin, Crestor, ramipril, Coreg, and fish oil.   PHYSICAL EXAMINATION:  Blood pressure 118/78, pulse 68, respirations 18,  saturation 96%.  He is alert and oriented.  Breath sounds are clear and  equal.  The sternum is stable and well healed.  cardiac rhythm is  regular without murmur or rub.  The leg incisions are healed.  There is  no peripheral edema.   DIAGNOSTIC TESTS:  A chest x-ray was not obtained at today's office  visit.   PLAN:  The patient has elected to do self rehab with a daily walking  schedule at home.  He works at [employer], building school buses, with  heavy lifting.  He is not ready to resume that level of activity at this  time, but he can drive and lift up to 20 pounds.  I told him he could  supplement his Tylenol with Advil for discomfort.  He will return in  about 4 weeks to reassess his return-to-  work status as he is now on short-term disability.  It is my impression  that with the degree of lifting required at his job that he will need to  stay out between 3 and 6  months.   Kerin Perna, M.D.  Electronically Signed   PV/MEDQ  D:  06/28/2010  T:  06/29/2010  Job:  [id number]   cc:   Armanda Magic, M.D.  [name], M.D.  Anna Genre Little, M.D.

## 2011-05-08 NOTE — Discharge Summary (Signed)
  [name], [name]                  ACCOUNT NO.:  [id number]  MEDICAL RECORD NO.:  [id number]           PATIENT TYPE:  I  LOCATION:  3008                         FACILITY:  [facility]  PHYSICIAN:  Danae Orleans. Venetia Maxon, M.D.  DATE OF BIRTH:  [birth year]  DATE OF ADMISSION:  04/19/2011 DATE OF DISCHARGE:  04/22/2011                              DISCHARGE SUMMARY   REASON FOR ADMISSION:  Lumbar disk herniation with stenosis, spondylosis, degenerative disk disease, and radiculopathy L4-5 and L5-S1 levels.  POSTOPERATIVE DIAGNOSES:  Lumbar disk herniation with stenosis, spondylosis, degenerative disk disease, and radiculopathy L4-5 and L5-S1 levels.  The patient underwent a procedure of L4-5 and L5-S1 decompression and transforaminal lumbar interbody fusion with segmental pedicle screw fixation L4 through S1 levels with posterolateral arthrodesis.  Prior to surgery, the patient had severe spondylosis and marked foraminal stenosis on the left at L5-S1 with L4-5 degeneration and spondylosis with stenosis and herniated disk.  The patient underwent uncomplicated surgical decompression and fusion at these two affected levels. Postoperatively, the patient was doing well, gradually mobilized, discontinued PCA on April 21, 2011, and was doing well on April 22, 2011, and was discharged home on April 22, 2011, in stable and satisfactory condition having tolerated his operation and hospitalization well.  DISCHARGE MEDICATIONS:  Multivitamin, hydrocodone, carvedilol 20 mg once daily.  Aspirin was held and olmesartan/hydrochlorothiazide 20/12.5 once daily.  Instructions were to follow up in my office 3 weeks postoperatively.     Danae Orleans. Venetia Maxon, M.D.     JDS/MEDQ  D:  05/01/2011  T:  05/01/2011  Job:  [id number]  Electronically Signed by Maeola Harman M.D. on 05/08/2011 07:44:02 AM

## 2011-07-19 ENCOUNTER — Emergency Department (HOSPITAL_COMMUNITY)
Admission: EM | Admit: 2011-07-19 | Discharge: 2011-07-19 | Discharge status: Against Medical Advice | Payer: No Typology Code available for payment source | Attending: Emergency Medicine | Admitting: Emergency Medicine

## 2011-07-19 ENCOUNTER — Emergency Department (HOSPITAL_COMMUNITY): Payer: No Typology Code available for payment source

## 2011-07-19 DIAGNOSIS — I10 Essential (primary) hypertension: Secondary | ICD-10-CM | POA: Insufficient documentation

## 2011-07-19 DIAGNOSIS — S139XXA Sprain of joints and ligaments of unspecified parts of neck, initial encounter: Secondary | ICD-10-CM | POA: Insufficient documentation

## 2011-07-19 DIAGNOSIS — S335XXA Sprain of ligaments of lumbar spine, initial encounter: Secondary | ICD-10-CM | POA: Insufficient documentation

## 2011-07-19 DIAGNOSIS — I25810 Atherosclerosis of coronary artery bypass graft(s) without angina pectoris: Secondary | ICD-10-CM | POA: Insufficient documentation

## 2011-07-19 DIAGNOSIS — Y92410 Unspecified street and highway as the place of occurrence of the external cause: Secondary | ICD-10-CM | POA: Insufficient documentation

## 2011-11-24 ENCOUNTER — Telehealth: Payer: Self-pay | Admitting: Physician Assistant

## 2011-11-24 NOTE — Telephone Encounter (Signed)
Pt called because ran out of BP medication yesterday. His PCP is Dr. [name]. He takes Benicar, but was unsure of the dose. His pharmacy was called, and a 30-day supply prescribed. He was advised that the pharmacy would call when the medication was ready and to follow-up with Dr. [name].

## 2011-12-18 ENCOUNTER — Inpatient Hospital Stay (HOSPITAL_COMMUNITY)
Admission: EM | Admit: 2011-12-18 | Discharge: 2011-12-20 | DRG: 566 | Disposition: A | Discharge status: Home / Self Care | Payer: Federal, State, Local not specified - PPO | Attending: Internal Medicine | Admitting: Internal Medicine

## 2011-12-18 ENCOUNTER — Other Ambulatory Visit: Payer: Self-pay

## 2011-12-18 ENCOUNTER — Inpatient Hospital Stay (HOSPITAL_COMMUNITY): Payer: Federal, State, Local not specified - PPO

## 2011-12-18 ENCOUNTER — Encounter: Payer: Self-pay | Admitting: *Deleted

## 2011-12-18 DIAGNOSIS — I252 Old myocardial infarction: Secondary | ICD-10-CM

## 2011-12-18 DIAGNOSIS — I2510 Atherosclerotic heart disease of native coronary artery without angina pectoris: Secondary | ICD-10-CM | POA: Diagnosis present

## 2011-12-18 DIAGNOSIS — R739 Hyperglycemia, unspecified: Secondary | ICD-10-CM

## 2011-12-18 DIAGNOSIS — E1100 Type 2 diabetes mellitus with hyperosmolarity without nonketotic hyperglycemic-hyperosmolar coma (NKHHC): Principal | ICD-10-CM | POA: Diagnosis present

## 2011-12-18 DIAGNOSIS — I5042 Chronic combined systolic (congestive) and diastolic (congestive) heart failure: Secondary | ICD-10-CM

## 2011-12-18 DIAGNOSIS — I25709 Atherosclerosis of coronary artery bypass graft(s), unspecified, with unspecified angina pectoris: Secondary | ICD-10-CM | POA: Diagnosis present

## 2011-12-18 DIAGNOSIS — F17200 Nicotine dependence, unspecified, uncomplicated: Secondary | ICD-10-CM | POA: Diagnosis present

## 2011-12-18 DIAGNOSIS — Z23 Encounter for immunization: Secondary | ICD-10-CM

## 2011-12-18 DIAGNOSIS — E871 Hypo-osmolality and hyponatremia: Secondary | ICD-10-CM | POA: Diagnosis present

## 2011-12-18 DIAGNOSIS — E118 Type 2 diabetes mellitus with unspecified complications: Secondary | ICD-10-CM | POA: Diagnosis present

## 2011-12-18 DIAGNOSIS — Z951 Presence of aortocoronary bypass graft: Secondary | ICD-10-CM

## 2011-12-18 DIAGNOSIS — E860 Dehydration: Secondary | ICD-10-CM | POA: Diagnosis present

## 2011-12-18 DIAGNOSIS — I10 Essential (primary) hypertension: Secondary | ICD-10-CM | POA: Diagnosis present

## 2011-12-18 DIAGNOSIS — E785 Hyperlipidemia, unspecified: Secondary | ICD-10-CM | POA: Diagnosis present

## 2011-12-18 DIAGNOSIS — N179 Acute kidney failure, unspecified: Secondary | ICD-10-CM | POA: Diagnosis present

## 2011-12-18 HISTORY — DX: Hyperlipidemia, unspecified: E78.5

## 2011-12-18 HISTORY — DX: Atherosclerotic heart disease of native coronary artery without angina pectoris: I25.10

## 2011-12-18 HISTORY — DX: Anemia, unspecified: D64.9

## 2011-12-18 HISTORY — DX: Essential (primary) hypertension: I10

## 2011-12-18 HISTORY — DX: Acute myocardial infarction, unspecified: I21.9

## 2011-12-18 LAB — CBC
HCT: 38 % — ABNORMAL LOW (ref 39.0–52.0)
MCHC: 36.8 g/dL — ABNORMAL HIGH (ref 30.0–36.0)
Platelets: 172 10*3/uL (ref 150–400)
RDW: 11.4 % — ABNORMAL LOW (ref 11.5–15.5)
WBC: 9 10*3/uL (ref 4.0–10.5)

## 2011-12-18 LAB — URINALYSIS, ROUTINE W REFLEX MICROSCOPIC
Bilirubin Urine: NEGATIVE
Bilirubin Urine: NEGATIVE
Glucose, UA: >1000 mg/dL — AB
Hgb urine dipstick: NEGATIVE
Leukocytes, UA: NEGATIVE
Nitrite: NEGATIVE
Protein, ur: NEGATIVE mg/dL
Specific Gravity, Urine: 1.027 (ref 1.005–1.030)
Specific Gravity, Urine: 1.01 (ref 1.005–1.030)
Urobilinogen, UA: 0.2 mg/dL (ref 0.0–1.0)
pH: 6.5 (ref 5.0–8.0)

## 2011-12-18 LAB — GLUCOSE, CAPILLARY
Glucose-Capillary: 456 mg/dL — ABNORMAL HIGH (ref 70–99)
Glucose-Capillary: 523 mg/dL — ABNORMAL HIGH (ref 70–99)

## 2011-12-18 LAB — URINE MICROSCOPIC-ADD ON

## 2011-12-18 LAB — POCT I-STAT 3, VENOUS BLOOD GAS (G3P V)
O2 Saturation: 82 %
pCO2, Ven: 47.2 mmHg (ref 45.0–50.0)
pH, Ven: 7.373 — ABNORMAL HIGH (ref 7.250–7.300)
pO2, Ven: 48 mmHg — ABNORMAL HIGH (ref 30.0–45.0)

## 2011-12-18 LAB — BASIC METABOLIC PANEL
BUN: 29 mg/dL — ABNORMAL HIGH (ref 6–23)
Chloride: 88 mEq/L — ABNORMAL LOW (ref 96–112)
Creatinine, Ser: 1.43 mg/dL — ABNORMAL HIGH (ref 0.50–1.35)
GFR calc Af Amer: 65 mL/min — ABNORMAL LOW (ref >90)
GFR calc non Af Amer: 56 mL/min — ABNORMAL LOW (ref >90)
Potassium: 4.5 mEq/L (ref 3.5–5.1)

## 2011-12-18 LAB — DIFFERENTIAL
Basophils Absolute: 0 10*3/uL (ref 0.0–0.1)
Basophils Relative: 0 % (ref 0–1)
Lymphocytes Relative: 25 % (ref 12–46)
Monocytes Absolute: 0.5 10*3/uL (ref 0.1–1.0)
Neutro Abs: 6.1 10*3/uL (ref 1.7–7.7)
Neutrophils Relative %: 68 % (ref 43–77)

## 2011-12-18 MED ORDER — ROSUVASTATIN CALCIUM 10 MG PO TABS
10.0000 mg | ORAL_TABLET | Freq: Every day | ORAL | Status: DC
  Administered 2011-12-19: 10 mg via ORAL
  Filled 2011-12-18 (×3): qty 1

## 2011-12-18 MED ORDER — CARVEDILOL 6.25 MG PO TABS
6.2500 mg | ORAL_TABLET | Freq: Two times a day (BID) | ORAL | Status: DC
  Administered 2011-12-19 – 2011-12-20 (×3): 6.25 mg via ORAL
  Filled 2011-12-18 (×5): qty 1

## 2011-12-18 MED ORDER — ONDANSETRON HCL 4 MG PO TABS: 4.0000 mg | ORAL_TABLET | Freq: Four times a day (QID) | ORAL | Status: DC | PRN

## 2011-12-18 MED ORDER — SODIUM CHLORIDE 0.9 % IV SOLN: INTRAVENOUS | Status: DC

## 2011-12-18 MED ORDER — SODIUM CHLORIDE 0.9 % IV BOLUS (SEPSIS)
1000.0000 mL | Freq: Once | INTRAVENOUS | Status: AC
Start: 2011-12-18 — End: 2011-12-18
  Administered 2011-12-18: 1000 mL via INTRAVENOUS

## 2011-12-18 MED ORDER — ZOLPIDEM TARTRATE 5 MG PO TABS: 5.0000 mg | ORAL_TABLET | Freq: Every evening | ORAL | Status: DC | PRN

## 2011-12-18 MED ORDER — ACETAMINOPHEN 325 MG PO TABS: 650.0000 mg | ORAL_TABLET | Freq: Four times a day (QID) | ORAL | Status: DC | PRN

## 2011-12-18 MED ORDER — DEXTROSE 50 % IV SOLN: 25.0000 mL | INTRAVENOUS | Status: DC | PRN

## 2011-12-18 MED ORDER — INSULIN ASPART 100 UNIT/ML ~~LOC~~ SOLN
0.0000 [IU] | Freq: Three times a day (TID) | SUBCUTANEOUS | Status: DC
  Administered 2011-12-19: 5 [IU] via SUBCUTANEOUS
  Administered 2011-12-19: 8 [IU] via SUBCUTANEOUS
  Administered 2011-12-20: 5 [IU] via SUBCUTANEOUS
  Filled 2011-12-18: qty 3

## 2011-12-18 MED ORDER — SODIUM CHLORIDE 0.9 % IV SOLN: Freq: Once | INTRAVENOUS | Status: DC

## 2011-12-18 MED ORDER — ACETAMINOPHEN 650 MG RE SUPP: 650.0000 mg | Freq: Four times a day (QID) | RECTAL | Status: DC | PRN

## 2011-12-18 MED ORDER — ENOXAPARIN SODIUM 40 MG/0.4ML ~~LOC~~ SOLN
40.0000 mg | SUBCUTANEOUS | Status: DC
  Administered 2011-12-18 – 2011-12-19 (×2): 40 mg via SUBCUTANEOUS
  Filled 2011-12-18 (×3): qty 0.4

## 2011-12-18 MED ORDER — PNEUMOCOCCAL VAC POLYVALENT 25 MCG/0.5ML IJ INJ
0.5000 mL | INJECTION | INTRAMUSCULAR | Status: AC
  Administered 2011-12-19: 0.5 mL via INTRAMUSCULAR
  Filled 2011-12-18: qty 0.5

## 2011-12-18 MED ORDER — OXYCODONE HCL 5 MG PO TABS: 5.0000 mg | ORAL_TABLET | ORAL | Status: DC | PRN

## 2011-12-18 MED ORDER — INSULIN REGULAR BOLUS VIA INFUSION
0.0000 [IU] | Freq: Three times a day (TID) | INTRAVENOUS | Status: DC
  Filled 2011-12-18 (×4): qty 10

## 2011-12-18 MED ORDER — SODIUM CHLORIDE 0.9 % IV SOLN
999.0000 mL | Freq: Once | INTRAVENOUS | Status: AC
  Administered 2011-12-18: 999 mL via INTRAVENOUS

## 2011-12-18 MED ORDER — DEXTROSE-NACL 5-0.45 % IV SOLN
INTRAVENOUS | Status: DC
  Administered 2011-12-19: 04:00:00 via INTRAVENOUS

## 2011-12-18 MED ORDER — INFLUENZA VIRUS VACC SPLIT PF IM SUSP
0.5000 mL | INTRAMUSCULAR | Status: AC
  Administered 2011-12-19: 0.5 mL via INTRAMUSCULAR
  Filled 2011-12-18: qty 0.5

## 2011-12-18 MED ORDER — SODIUM CHLORIDE 0.9 % IV SOLN
INTRAVENOUS | Status: DC
  Administered 2011-12-18: 22:00:00 via INTRAVENOUS

## 2011-12-18 MED ORDER — SENNOSIDES-DOCUSATE SODIUM 8.6-50 MG PO TABS: 1.0000 | ORAL_TABLET | Freq: Every evening | ORAL | Status: DC | PRN

## 2011-12-18 MED ORDER — ALUM & MAG HYDROXIDE-SIMETH 200-200-20 MG/5ML PO SUSP: 30.0000 mL | Freq: Four times a day (QID) | ORAL | Status: DC | PRN

## 2011-12-18 MED ORDER — SODIUM CHLORIDE 0.9 % IV SOLN
INTRAVENOUS | Status: DC
  Administered 2011-12-18: 4 [IU]/h via INTRAVENOUS
  Administered 2011-12-18: 4.8 [IU]/h via INTRAVENOUS
  Administered 2011-12-19: 1.2 [IU]/h via INTRAVENOUS
  Administered 2011-12-19: 0.5 [IU]/h via INTRAVENOUS
  Administered 2011-12-19: 3 [IU]/h via INTRAVENOUS
  Filled 2011-12-18: qty 1

## 2011-12-18 MED ORDER — ONDANSETRON HCL 4 MG/2ML IJ SOLN: 4.0000 mg | Freq: Four times a day (QID) | INTRAMUSCULAR | Status: DC | PRN

## 2011-12-18 MED ORDER — INSULIN GLARGINE 100 UNIT/ML ~~LOC~~ SOLN
10.0000 [IU] | Freq: Every day | SUBCUTANEOUS | Status: DC
  Administered 2011-12-18: 10 [IU] via SUBCUTANEOUS
  Filled 2011-12-18: qty 3

## 2011-12-18 NOTE — ED Notes (Signed)
5501-01 READY

## 2011-12-18 NOTE — ED Provider Notes (Addendum)
History     CSN: [id number]  Arrival date & time 12/18/11  1419   First MD Initiated Contact with Patient 12/18/11 1457      Chief Complaint  Patient presents with  . Blurred Vision  . Polydipsia  . Fatigue    (Consider location/radiation/quality/duration/timing/severity/associated sxs/prior treatment) HPI Comments: 33M presents the emergency department with 5 days of blurry vision, polyuria, polydipsia. He also notes generalized fatigue and malaise during this time. He associated nausea but denies vomiting, abdominal pain, chest pain, shortness of breath. He states both his parents are diabetic. He has no prior history of diabetes. He denies any paresthesias or numbness. On arrival to emergency department his CBG was greater than 600. His primary care physician is with [facility] physicians.  No specific exacerbating or alleviating measures.  The history is provided by the patient. No language interpreter was used.    Past Medical History  Diagnosis Date  . Hypertension     Past Surgical History  Procedure Date  . Back surgery     History reviewed. No pertinent family history.  History  Substance Use Topics  . Smoking status: Current Some Day Smoker  . Smokeless tobacco: Not on file  . Alcohol Use: No      Review of Systems  Constitutional: Positive for activity change. Negative for fever and chills.  HENT: Negative for congestion, sore throat, rhinorrhea, neck pain and neck stiffness.   Eyes: Positive for visual disturbance. Negative for photophobia.  Respiratory: Negative for cough and shortness of breath.   Cardiovascular: Negative for palpitations.  Gastrointestinal: Positive for nausea. Negative for vomiting and abdominal pain.  Genitourinary: Positive for frequency. Negative for dysuria, urgency and flank pain.       Polydipsia  Musculoskeletal: Negative for myalgias, back pain and arthralgias.  Neurological: Negative for light-headedness and numbness.  All  other systems reviewed and are negative.    Allergies  Imdur  Home Medications   Current Outpatient Rx  Name Route Sig Dispense Refill  . CARVEDILOL 6.25 MG PO TABS Oral Take 6.25 mg by mouth 2 (two) times daily with a meal.      . OLMESARTAN MEDOXOMIL-HCTZ 20-12.5 MG PO TABS Oral Take 1 tablet by mouth daily.      Marland Kitchen ROSUVASTATIN CALCIUM 10 MG PO TABS Oral Take 10 mg by mouth daily.        BP 100/64  Pulse 82  Temp(Src) 98.2 F (36.8 C) (Oral)  Resp 20  SpO2 96%  Physical Exam  Nursing note and vitals reviewed. Constitutional: He is oriented to person, place, and time. He appears well-developed and well-nourished. No distress.  HENT:  Head: Normocephalic and atraumatic.  Mouth/Throat: Oropharynx is clear and moist.  Eyes: Conjunctivae and EOM are normal. Pupils are equal, round, and reactive to light.  Neck: Normal range of motion. Neck supple.  Cardiovascular: Normal rate, regular rhythm, normal heart sounds and intact distal pulses.  Exam reveals no gallop and no friction rub.   No murmur heard. Pulmonary/Chest: Effort normal and breath sounds normal. No respiratory distress. He exhibits no tenderness.  Abdominal: Soft. Bowel sounds are normal. There is no tenderness.  Musculoskeletal: Normal range of motion. He exhibits no tenderness.  Lymphadenopathy:    He has no cervical adenopathy.  Neurological: He is alert and oriented to person, place, and time. No cranial nerve deficit.  Skin: Skin is warm and dry. No rash noted.    ED Course  Procedures (including critical care time)  Date: 12/18/2011  Rate: 67  Rhythm: normal sinus rhythm  QRS Axis: normal  Intervals: normal  ST/T Wave abnormalities: normal  Conduction Disutrbances:none  Narrative Interpretation:   Old EKG Reviewed: unchanged  Labs Reviewed  GLUCOSE, CAPILLARY - Abnormal; Notable for the following:    Glucose-Capillary >600 (*)    All other components within normal limits  BASIC METABOLIC PANEL  - Abnormal; Notable for the following:    Sodium 128 (*)    Chloride 88 (*)    Glucose, Bld 621 (*)    BUN 29 (*)    Creatinine, Ser 1.43 (*)    GFR calc non Af Amer 56 (*)    GFR calc Af Amer 65 (*)    All other components within normal limits  CBC - Abnormal; Notable for the following:    HCT 38.0 (*)    MCHC 36.8 (*)    RDW 11.4 (*)    All other components within normal limits  POCT I-STAT 3, BLOOD GAS (G3P V) - Abnormal; Notable for the following:    pH, Ven 7.373 (*)    pO2, Ven 48.0 (*)    Bicarbonate 27.5 (*)    All other components within normal limits  DIFFERENTIAL  URINALYSIS, ROUTINE W REFLEX MICROSCOPIC  POCT CBG MONITORING  BLOOD GAS, VENOUS   No results found.   1. Diabetes mellitus   2. Hyperglycemia       MDM  New onset diabetes. Patient is found to be significantly hyperglycemic with a blood sugar 600 on arrival. He is placed on IV fluids as well as an insulin drip. He'll require admission for further insulin management as well as diabetic teaching. There is no evidence of DKA on laboratory studies. Discussed with the triad hospitalists wouldn't the patient for further evaluation and treatment        Dayton Bailiff, MD 12/18/11 1610  Dayton Bailiff, MD 12/18/11 [phone]

## 2011-12-18 NOTE — ED Notes (Signed)
Pt reports blurred vision x 5 days. Having excessive thirst and generalized weakness.

## 2011-12-18 NOTE — H&P (Signed)
Rodney Gregory MRN: [id number] DOB/AGE: 50-Mar-1962 50 y.o. 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 Patient denies any fevers no chest pain, no shortness of breath, no nausea, no vomiting, no fever, no abdominal pain, no diarrhea, no constipation, no dysuria. As soon emergency room and his blood glucose was noted to be about 600. A beam that which was obtained showed a sodium level of 128 a BUN/creatinine of 29/1.43. CBC which was obtained was within normal limits. Patient was placed on IV fluids and the glucose stabilized we will call to admit the patient for further evaluation and management.  Past Medical History  Diagnosis Date  . Hypertension   . Anemia     hx of blood loss anemia  . Myocardial infarction   . Hyperlipidemia   . Coronary artery disease     Past Surgical History  Procedure Date  . Back surgery   . Coronary artery bypass graft 02/2010  . Rotator cuff repair 2009    right    Prior to Admission medications   Medication Sig Start Date End Date Taking? Authorizing Provider  carvedilol (COREG) 6.25 MG tablet Take 6.25 mg by mouth 2 (two) times daily with a meal.     Yes Historical Provider, MD  olmesartan-hydrochlorothiazide (BENICAR HCT) 20-12.5 MG per tablet Take 1 tablet by mouth daily.     Yes Historical Provider, MD  rosuvastatin (CRESTOR) 10 MG tablet Take 10 mg by mouth daily.     Yes Historical Provider, MD    Allergies:  Allergies  Allergen Reactions  .  Imdur (Isosorbide Mononitrate) Other (See Comments)    Chest pain    Family History  Problem Relation Age of Onset  . Heart attack Mother   . Hypertension Mother   . Diabetes Mother   . Hypertension Father     Social History:  reports that he has been smoking.  He does not have any smokeless tobacco history on file. He reports that he does not drink alcohol or use illicit drugs.  ROS: All systems reviewed with the patient and was positive as per HPI otherwise all other systems are negative.  PHYSICAL EXAM: Blood pressure 101/61, pulse 86, temperature 98 F (36.7 C), temperature source Oral, resp. rate 19, SpO2 95.00%. General: Well-developed, well-nourished in no acute cardiopulmonary distress.  HEENT: Normocephalic, atraumatic. Pupils equal round and reactive to light and accommodation. Extraocular movements intact.  Oropharynx with is clear no lesions no exudates neck is supplewith no lymphadenopathy. No bruits, no goiter. Heart: Regular rate and rhythm, without murmurs, rubs, gallops. Lungs: Clear to auscultation bilaterally. Abdomen: Soft, nontender, nondistended, positive bowel sounds. Extremities: No clubbing cyanosis or edema with positive pedal pulses. Neuro:  alert and oriented x3. Cranial nerves II through XII are grossly intact. No focal deficits.     EKG: None   No results found for this or any previous visit (from the past 240 hour(s)).   Lab results:  [facility] 12/18/11 1508  NA 128*  K 4.5  CL 88*  CO2 26  GLUCOSE 621*  BUN  29*  CREATININE 1.43*  CALCIUM 9.6  MG --  PHOS --   No results found for this basename: AST:2,ALT:2,ALKPHOS:2,BILITOT:2,PROT:2,ALBUMIN:2 in the last 72 hours No results found for this basename: LIPASE:2,AMYLASE:2 in the last 72 hours  Basename 12/18/11 1508  WBC 9.0  NEUTROABS 6.1  HGB 14.0  HCT 38.0*  MCV 89.8  PLT 172   No results found for this basename: CKTOTAL:3,CKMB:3,CKMBINDEX:3,TROPONINI:3 in the last 72 hours No  components found with this basename: POCBNP:3 No results found for this basename: DDIMER in the last 72 hours No results found for this basename: HGBA1C:2 in the last 72 hours No results found for this basename: CHOL:2,HDL:2,LDLCALC:2,TRIG:2,CHOLHDL:2,LDLDIRECT:2 in the last 72 hours No results found for this basename: TSH,T4TOTAL,FREET3,T3FREE,THYROIDAB in the last 72 hours No results found for this basename: VITAMINB12:2,FOLATE:2,FERRITIN:2,TIBC:2,IRON:2,RETICCTPCT:2 in the last 72 hours Imaging results:  No results found. Impression/Plan:  Principal Problem:  *Type 2 diabetes mellitus with hyperosmolar nonketotic hyperglycemia Active Problems:  Diabetes mellitus  Hyponatremia  ARF (acute renal failure)  Dehydration  HTN (hypertension)  Hyperlipidemia  CAD (coronary artery disease)  S/P CABG x 3  Old MI (myocardial infarction)   #1 new onset diabetes with hyperosmolar no acute toxic hyperglycemia Patient is presenting with symptoms of new-onset diabetes of polyuria, polydipsia blurred vision, fatigue with this blood sugars greater than 600. Will admit the patient to telemetry. We'll check an EKG. The comprehensive metabolic profile. We'll check a hemoglobin A1c. We'll check a urine UA with cultures and sensitivities. We'll check a chest x-ray. We'll check a fasting lipid panel. Will hydrate with IV fluids. We'll place him on the glucose stabilizer. We'll get diabetes education. Follow. #2 hyponatremia Likely secondary to pseudohyponatremia secondary to #1. IV fluids. Follow #3 acute renal failure Likely secondary to prerenal azotemia. We'll check a fractional excretion of sodium. We'll check a UA with cultures and sensitivities. Will hold patient's ACE inhibitor and diuretic.-Admit IV fluids. Follow. #4 dehydration Hydrate with IV fluids. #5 hypertension Continue home dose beta blocker. Will will hold patient's ACE inhibitor and diuretic secondary to problem #3. #6  hyperlipidemia Check a fasting lipid panel. Continue home regimen of Crestor. #7 coronary artery disease/status post CABG x3 Stable. Will get EKG. Continue home regimen of beta blocker. Will hold ACE inhibitor and diuretic secondary to problem #3. #8 prophylaxis Lovenox for DVT prophylaxis. Has been a pleasure taking care of Mr. Chiarelli.   THOMPSON,DANIEL 12/18/2011, 6:02 PM

## 2011-12-19 LAB — CBC
HCT: 34 % — ABNORMAL LOW (ref 39.0–52.0)
HCT: 34.6 % — ABNORMAL LOW (ref 39.0–52.0)
Hemoglobin: 12.5 g/dL — ABNORMAL LOW (ref 13.0–17.0)
MCH: 31.7 pg (ref 26.0–34.0)
MCHC: 35 g/dL (ref 30.0–36.0)
MCHC: 36.1 g/dL — ABNORMAL HIGH (ref 30.0–36.0)
MCV: 90.7 fL (ref 78.0–100.0)
RBC: 3.87 MIL/uL — ABNORMAL LOW (ref 4.22–5.81)
RDW: 11.7 % (ref 11.5–15.5)
WBC: 8.5 10*3/uL (ref 4.0–10.5)

## 2011-12-19 LAB — GLUCOSE, CAPILLARY
Glucose-Capillary: 113 mg/dL — ABNORMAL HIGH (ref 70–99)
Glucose-Capillary: 122 mg/dL — ABNORMAL HIGH (ref 70–99)
Glucose-Capillary: 153 mg/dL — ABNORMAL HIGH (ref 70–99)
Glucose-Capillary: 161 mg/dL — ABNORMAL HIGH (ref 70–99)

## 2011-12-19 LAB — LIPID PANEL
Cholesterol: 86 mg/dL (ref 0–200)
LDL Cholesterol: 36 mg/dL (ref 0–99)
Total CHOL/HDL Ratio: 5.1 RATIO
VLDL: 33 mg/dL (ref 0–40)

## 2011-12-19 LAB — COMPREHENSIVE METABOLIC PANEL
ALT: 63 U/L — ABNORMAL HIGH (ref 0–53)
Alkaline Phosphatase: 76 U/L (ref 39–117)
Chloride: 105 mEq/L (ref 96–112)
GFR calc Af Amer: 89 mL/min — ABNORMAL LOW (ref >90)
Glucose, Bld: 198 mg/dL — ABNORMAL HIGH (ref 70–99)
Potassium: 3.7 mEq/L (ref 3.5–5.1)
Sodium: 139 mEq/L (ref 135–145)
Total Bilirubin: 0.3 mg/dL (ref 0.3–1.2)
Total Protein: 6.7 g/dL (ref 6.0–8.3)

## 2011-12-19 LAB — BASIC METABOLIC PANEL
BUN: 18 mg/dL (ref 6–23)
Calcium: 8.4 mg/dL (ref 8.4–10.5)
Creatinine, Ser: 1.06 mg/dL (ref 0.50–1.35)
GFR calc Af Amer: >90 mL/min (ref >90)
GFR calc non Af Amer: 80 mL/min — ABNORMAL LOW (ref >90)

## 2011-12-19 LAB — HEMOGLOBIN A1C
Hgb A1c MFr Bld: 14.1 % — ABNORMAL HIGH (ref <5.7)
Hgb A1c MFr Bld: 14.6 % — ABNORMAL HIGH (ref <5.7)
Mean Plasma Glucose: 358 mg/dL — ABNORMAL HIGH (ref <117)
Mean Plasma Glucose: 372 mg/dL — ABNORMAL HIGH (ref <117)

## 2011-12-19 MED ORDER — BD GETTING STARTED TAKE HOME KIT: 3/10ML X 30G SYRINGES
1.0000 | Freq: Once | Status: AC
  Administered 2011-12-19: 1
  Filled 2011-12-19: qty 1

## 2011-12-19 MED ORDER — INSULIN ASPART PROT & ASPART (70-30 MIX) 100 UNIT/ML ~~LOC~~ SUSP
20.0000 [IU] | Freq: Two times a day (BID) | SUBCUTANEOUS | Status: DC
  Administered 2011-12-19 – 2011-12-20 (×2): 20 [IU] via SUBCUTANEOUS
  Filled 2011-12-19: qty 3

## 2011-12-19 MED ORDER — INSULIN ASPART 100 UNIT/ML ~~LOC~~ SOLN
5.0000 [IU] | Freq: Once | SUBCUTANEOUS | Status: AC
  Administered 2011-12-19: 5 [IU] via SUBCUTANEOUS

## 2011-12-19 MED ORDER — LIVING WELL WITH DIABETES BOOK
Freq: Once | Status: AC
  Administered 2011-12-19: 15:00:00
  Filled 2011-12-19: qty 1

## 2011-12-19 MED ORDER — INSULIN PEN STARTER KIT
1.0000 | Freq: Once | Status: AC
  Administered 2011-12-19: 1
  Filled 2011-12-19: qty 1

## 2011-12-19 NOTE — Plan of Care (Signed)
Problem: Food- and Nutrition-Related Knowledge Deficit (NB-1.1) Goal: Nutrition education Formal process to instruct or train a patient/client in a skill or to impart knowledge to help patients/clients voluntarily manage or modify food choices and eating behavior to maintain or improve health.  Outcome: Completed/Met Date Met:  12/19/11 RD provided patient with [employer] carbohydrate counting hand out. Patient stated he had no prior nutrition knowledge. RD verbally went over materials with the patient. Patient asked appropriate questions and was able to verbally demonstrate understanding. Patient had no more questions. RD provided contact information to the patient for further education needs.

## 2011-12-19 NOTE — Progress Notes (Signed)
Inpatient Diabetes Program Recommendations  AACE/ADA: New Consensus Statement on Inpatient Glycemic Control (2009)  Target Ranges:  Prepandial:   less than 140 mg/dL      Peak postprandial:   less than 180 mg/dL (1-2 hours)      Critically ill patients:  140 - 180 mg/dL   Reason: Consult for new-onset DM  Inpatient Diabetes Program Recommendations Insulin - Basal: increase Lantus to 20 units Outpatient Referral: Order for outpatient education at [facility])  Note: Ordered Museum/gallery exhibitions officer for RN to begin patient education.  A1C=14.6  Will continue to follow. Thanks, Piedad Climes RN, Diabetes Coordinator

## 2011-12-19 NOTE — Progress Notes (Signed)
Inpatient Diabetes Program Recommendations  AACE/ADA: New Consensus Statement on Inpatient Glycemic Control (2009)  Target Ranges:  Prepandial:   less than 140 mg/dL      Peak postprandial:   less than 180 mg/dL (1-2 hours)      Critically ill patients:  140 - 180 mg/dL   Reason for Visit: New-onset DM  Inpatient Diabetes Program Recommendations Insulin - Basal: DC Lantus and begin Novolog 70/30 20 units with Breakfast and dinner Outpatient Referral: Order for outpatient education at [facility])  Note: Spoke with patient concerning A1C=14.6 (372) patient is open to starting insulin if necessary but would prefer twice daily injection vs. Basal/bolus therapy. Patient prefers an insulin pen and that should be fine bc patient does have insurance. Encouraged lifestyle modification with diet and exercise.  Patient will talk to his wife about attending OP classes at [facility]  Patient is ready to learn but trying not to be overwhelmed by all of the information.  Patient will need a glucose meter, lancets and strips. A pre-printed order has been placed in the wall-a-roo at patient's room.  Will continue to follow.  Thanks, Piedad Climes RN, Diabetes Coordinator

## 2011-12-19 NOTE — Progress Notes (Signed)
Subjective: Patient seen and examined, denies any complaints.  Objective: Vital signs in last 24 hours: Temp:  [97.2 F (36.2 C)-98.5 F (36.9 C)] 98.5 F (36.9 C) (12/26 1347) Pulse Rate:  [61-86] 70  (12/26 1347) Resp:  [18-25] 18  (12/26 1347) BP: (90-118)/(61-84) 108/67 mmHg (12/26 1347) SpO2:  [94 %-97 %] 95 % (12/26 1347) Weight:  [92.7 kg (204 lb 5.9 oz)-92.8 kg (204 lb 9.4 oz)] 204 lb 5.9 oz (92.7 kg) (12/26 0500) Weight change:  Last BM Date: 12/18/11  Intake/Output from previous day: 12/25 0701 - 12/26 0700 In: 360.3 [I.V.:360.3] Out: 500 [Urine:500] Total I/O In: 240 [P.O.:240] Out: 650 [Urine:650]   Physical Exam: General: Alert, awake, oriented x3, in no acute distress. HEENT: No bruits, no goiter. Heart: Regular rate and rhythm, without murmurs, rubs, gallops. Lungs: Clear to auscultation bilaterally. Abdomen: Soft, nontender, nondistended, positive bowel sounds. Extremities: No clubbing cyanosis or edema with positive pedal pulses. Neuro: Grossly intact, nonfocal.    Lab Results: Results for orders placed during the hospital encounter of 12/18/11 (from the past 24 hour(s))  POCT I-STAT 3, BLOOD GAS (G3P V)     Status: Abnormal   Collection Time   12/18/11  3:34 PM      Component Value Range   pH, Ven 7.373 (*) 7.250 - 7.300    pCO2, Ven 47.2  45.0 - 50.0 (mmHg)   pO2, Ven 48.0 (*) 30.0 - 45.0 (mmHg)   Bicarbonate 27.5 (*) 20.0 - 24.0 (mEq/L)   TCO2 29  0 - 100 (mmol/L)   O2 Saturation 82.0     Acid-Base Excess 1.0  0.0 - 2.0 (mmol/L)   Sample type VENOUS    GLUCOSE, CAPILLARY     Status: Abnormal   Collection Time   12/18/11  4:56 PM      Component Value Range   Glucose-Capillary 456 (*) 70 - 99 (mg/dL)  HEMOGLOBIN Y7W     Status: Abnormal   Collection Time   12/18/11  5:57 PM      Component Value Range   Hemoglobin A1C 14.1 (*) <5.7 (%)   Mean Plasma Glucose 358 (*) <117 (mg/dL)  URINALYSIS, ROUTINE W REFLEX MICROSCOPIC     Status:  Abnormal   Collection Time   12/18/11  6:36 PM      Component Value Range   Color, Urine YELLOW  YELLOW    APPearance CLEAR  CLEAR    Specific Gravity, Urine 1.027  1.005 - 1.030    pH 6.5  5.0 - 8.0    Glucose, UA >1000 (*) NEGATIVE (mg/dL)   Hgb urine dipstick NEGATIVE  NEGATIVE    Bilirubin Urine NEGATIVE  NEGATIVE    Ketones, ur 15 (*) NEGATIVE (mg/dL)   Protein, ur NEGATIVE  NEGATIVE (mg/dL)   Urobilinogen, UA 0.2  0.0 - 1.0 (mg/dL)   Nitrite NEGATIVE  NEGATIVE    Leukocytes, UA NEGATIVE  NEGATIVE   URINE MICROSCOPIC-ADD ON     Status: Normal   Collection Time   12/18/11  6:36 PM      Component Value Range   WBC, UA 0-2  <3 (WBC/hpf)  GLUCOSE, CAPILLARY     Status: Abnormal   Collection Time   12/18/11  6:50 PM      Component Value Range   Glucose-Capillary 316 (*) 70 - 99 (mg/dL)  GLUCOSE, CAPILLARY     Status: Abnormal   Collection Time   12/18/11  8:19 PM      Component Value  Range   Glucose-Capillary 523 (*) 70 - 99 (mg/dL)  GLUCOSE, CAPILLARY     Status: Abnormal   Collection Time   12/18/11  9:23 PM      Component Value Range   Glucose-Capillary 488 (*) 70 - 99 (mg/dL)  URINALYSIS, ROUTINE W REFLEX MICROSCOPIC     Status: Abnormal   Collection Time   12/18/11  9:26 PM      Component Value Range   Color, Urine YELLOW  YELLOW    APPearance CLEAR  CLEAR    Specific Gravity, Urine 1.010  1.005 - 1.030    pH 7.0  5.0 - 8.0    Glucose, UA >1000 (*) NEGATIVE (mg/dL)   Hgb urine dipstick NEGATIVE  NEGATIVE    Bilirubin Urine NEGATIVE  NEGATIVE    Ketones, ur NEGATIVE  NEGATIVE (mg/dL)   Protein, ur NEGATIVE  NEGATIVE (mg/dL)   Urobilinogen, UA 0.2  0.0 - 1.0 (mg/dL)   Nitrite NEGATIVE  NEGATIVE    Leukocytes, UA NEGATIVE  NEGATIVE   SODIUM, URINE, RANDOM     Status: Normal   Collection Time   12/18/11  9:26 PM      Component Value Range   Sodium, Ur 81    CREATININE, URINE, RANDOM     Status: Normal   Collection Time   12/18/11  9:26 PM      Component  Value Range   Creatinine, Urine 34.05    URINE MICROSCOPIC-ADD ON     Status: Normal   Collection Time   12/18/11  9:26 PM      Component Value Range   WBC, UA 0-2  <3 (WBC/hpf)   RBC / HPF 0-2  <3 (RBC/hpf)   Bacteria, UA RARE  RARE   GLUCOSE, CAPILLARY     Status: Abnormal   Collection Time   12/18/11 10:26 PM      Component Value Range   Glucose-Capillary 337 (*) 70 - 99 (mg/dL)  GLUCOSE, CAPILLARY     Status: Abnormal   Collection Time   12/18/11 11:31 PM      Component Value Range   Glucose-Capillary 221 (*) 70 - 99 (mg/dL)  CBC     Status: Abnormal   Collection Time   12/18/11 11:58 PM      Component Value Range   WBC 8.5  4.0 - 10.5 (K/uL)   RBC 3.87 (*) 4.22 - 5.81 (MIL/uL)   Hemoglobin 12.5 (*) 13.0 - 17.0 (g/dL)   HCT 40.9 (*) 81.1 - 52.0 (%)   MCV 89.4  78.0 - 100.0 (fL)   MCH 32.3  26.0 - 34.0 (pg)   MCHC 36.1 (*) 30.0 - 36.0 (g/dL)   RDW 91.4  78.2 - 95.6 (%)   Platelets 157  150 - 400 (K/uL)  COMPREHENSIVE METABOLIC PANEL     Status: Abnormal   Collection Time   12/18/11 11:58 PM      Component Value Range   Sodium 139  135 - 145 (mEq/L)   Potassium 3.7  3.5 - 5.1 (mEq/L)   Chloride 105  96 - 112 (mEq/L)   CO2 24  19 - 32 (mEq/L)   Glucose, Bld 198 (*) 70 - 99 (mg/dL)   BUN 20  6 - 23 (mg/dL)   Creatinine, Ser 2.13  0.50 - 1.35 (mg/dL)   Calcium 8.6  8.4 - 08.6 (mg/dL)   Total Protein 6.7  6.0 - 8.3 (g/dL)   Albumin 3.4 (*) 3.5 - 5.2 (g/dL)   AST  33  0 - 37 (U/L)   ALT 63 (*) 0 - 53 (U/L)   Alkaline Phosphatase 76  39 - 117 (U/L)   Total Bilirubin 0.3  0.3 - 1.2 (mg/dL)   GFR calc non Af Amer 77 (*) >90 (mL/min)   GFR calc Af Amer 89 (*) >90 (mL/min)  MAGNESIUM     Status: Normal   Collection Time   12/18/11 11:58 PM      Component Value Range   Magnesium 2.1  1.5 - 2.5 (mg/dL)  TSH     Status: Normal   Collection Time   12/18/11 11:58 PM      Component Value Range   TSH 1.193  0.350 - 4.500 (uIU/mL)  HEMOGLOBIN A1C     Status: Abnormal    Collection Time   12/18/11 11:58 PM      Component Value Range   Hemoglobin A1C 14.6 (*) <5.7 (%)   Mean Plasma Glucose 372 (*) <117 (mg/dL)  GLUCOSE, CAPILLARY     Status: Abnormal   Collection Time   12/19/11 12:36 AM      Component Value Range   Glucose-Capillary 161 (*) 70 - 99 (mg/dL)  GLUCOSE, CAPILLARY     Status: Abnormal   Collection Time   12/19/11  1:40 AM      Component Value Range   Glucose-Capillary 122 (*) 70 - 99 (mg/dL)  GLUCOSE, CAPILLARY     Status: Abnormal   Collection Time   12/19/11  2:43 AM      Component Value Range   Glucose-Capillary 113 (*) 70 - 99 (mg/dL)  GLUCOSE, CAPILLARY     Status: Abnormal   Collection Time   12/19/11  3:48 AM      Component Value Range   Glucose-Capillary 134 (*) 70 - 99 (mg/dL)  GLUCOSE, CAPILLARY     Status: Abnormal   Collection Time   12/19/11  4:54 AM      Component Value Range   Glucose-Capillary 153 (*) 70 - 99 (mg/dL)  CBC     Status: Abnormal   Collection Time   12/19/11  5:30 AM      Component Value Range   WBC 6.7  4.0 - 10.5 (K/uL)   RBC 3.75 (*) 4.22 - 5.81 (MIL/uL)   Hemoglobin 11.9 (*) 13.0 - 17.0 (g/dL)   HCT 16.1 (*) 09.6 - 52.0 (%)   MCV 90.7  78.0 - 100.0 (fL)   MCH 31.7  26.0 - 34.0 (pg)   MCHC 35.0  30.0 - 36.0 (g/dL)   RDW 04.5  40.9 - 81.1 (%)   Platelets 159  150 - 400 (K/uL)  BASIC METABOLIC PANEL     Status: Abnormal   Collection Time   12/19/11  5:30 AM      Component Value Range   Sodium 139  135 - 145 (mEq/L)   Potassium 3.7  3.5 - 5.1 (mEq/L)   Chloride 106  96 - 112 (mEq/L)   CO2 24  19 - 32 (mEq/L)   Glucose, Bld 157 (*) 70 - 99 (mg/dL)   BUN 18  6 - 23 (mg/dL)   Creatinine, Ser 9.14  0.50 - 1.35 (mg/dL)   Calcium 8.4  8.4 - 78.2 (mg/dL)   GFR calc non Af Amer 80 (*) >90 (mL/min)   GFR calc Af Amer >90  >90 (mL/min)  LIPID PANEL     Status: Abnormal   Collection Time   12/19/11  5:30 AM  Component Value Range   Cholesterol 86  0 - 200 (mg/dL)   Triglycerides 161 (*)  <150 (mg/dL)   HDL 17 (*) >09 (mg/dL)   Total CHOL/HDL Ratio 5.1     VLDL 33  0 - 40 (mg/dL)   LDL Cholesterol 36  0 - 99 (mg/dL)  GLUCOSE, CAPILLARY     Status: Abnormal   Collection Time   12/19/11  5:59 AM      Component Value Range   Glucose-Capillary 151 (*) 70 - 99 (mg/dL)  GLUCOSE, CAPILLARY     Status: Abnormal   Collection Time   12/19/11  7:10 AM      Component Value Range   Glucose-Capillary 209 (*) 70 - 99 (mg/dL)    Studies/Results: X-ray Chest Pa And Lateral   12/18/2011  *RADIOLOGY REPORT*  Clinical Data: Pneumonia  CHEST - 2 VIEW  Comparison: None.  Findings: Sternotomy wires overlie stable heart silhouette. Costophrenic angles are clear.  No effusion, infiltrate, or pneumothorax Degenerative osteophytosis of the thoracic spine.  IMPRESSION: No acute cardiopulmonary process.  Original Report Authenticated By: Genevive Bi, M.D.    Medications:    . sodium chloride  999 mL Intravenous Once  . sodium chloride   Intravenous Once  . bd getting started take home kit  1 kit Other Once  . carvedilol  6.25 mg Oral BID WC  . enoxaparin  40 mg Subcutaneous Q24H  . influenza  inactive virus vaccine  0.5 mL Intramuscular Tomorrow-1000  . insulin aspart  0-15 Units Subcutaneous TID WC  . insulin glargine  10 Units Subcutaneous QHS  . living well with diabetes book   Does not apply Once  . pneumococcal 23 valent vaccine  0.5 mL Intramuscular Tomorrow-1000  . rosuvastatin  10 mg Oral q1800  . sodium chloride  1,000 mL Intravenous Once  . DISCONTD: insulin regular  0-10 Units Intravenous TID WC    acetaminophen, acetaminophen, alum & mag hydroxide-simeth, dextrose, ondansetron (ZOFRAN) IV, ondansetron, oxyCODONE, senna-docusate, zolpidem     . DISCONTD: sodium chloride 125 mL/hr at 12/18/11 2142  . DISCONTD: sodium chloride    . DISCONTD: sodium chloride    . DISCONTD: dextrose 5 % and 0.45% NaCl 100 mL/hr at 12/19/11 0416  . DISCONTD: insulin (NOVOLIN-R) infusion  0.5 Units/hr (12/19/11 0244)  . DISCONTD: insulin (NOVOLIN-R) infusion 9.2 Units/hr (12/18/11 2026)    Assessment/Plan:  Principal Problem:  *Type 2 diabetes mellitus with hyperosmolar nonketotic hyperglycemia HBAIC 14.6% Currently off glucoses stabilizer  On lantus insulin 10 units qhs  Diabetic coordinator was consulted she stated that patient prefers insulin 70/30 twice a day,will start with 20 unites bid  Active Problems:  Hyponatremia Secondary to hyperglycemia, resolved  ARF (acute renal failure) Secondary to dehydration, resolved  HTN (hypertension) Controlled, consider adding ACE inhibitor if blood pressure will allow.  Hyperlipidemia Continue Crestor  CAD (coronary artery disease)  S/P CABG x 3     LOS: 1 day   Rodney Gregory 12/19/2011, 3:33 PM

## 2011-12-19 NOTE — Progress Notes (Signed)
Utilization Review Completed.  Kieli Golladay T  12/19/2011 

## 2011-12-20 ENCOUNTER — Encounter (HOSPITAL_COMMUNITY): Payer: Self-pay | Admitting: *Deleted

## 2011-12-20 LAB — BASIC METABOLIC PANEL
BUN: 13 mg/dL (ref 6–23)
Chloride: 106 mEq/L (ref 96–112)
Creatinine, Ser: 0.99 mg/dL (ref 0.50–1.35)
Glucose, Bld: 192 mg/dL — ABNORMAL HIGH (ref 70–99)
Potassium: 3.7 mEq/L (ref 3.5–5.1)

## 2011-12-20 LAB — GLUCOSE, CAPILLARY
Glucose-Capillary: 205 mg/dL — ABNORMAL HIGH (ref 70–99)
Glucose-Capillary: 219 mg/dL — ABNORMAL HIGH (ref 70–99)

## 2011-12-20 LAB — URINE CULTURE
Colony Count: NO GROWTH
Culture  Setup Time: 201212252259

## 2011-12-20 MED ORDER — INSULIN ASPART PROT & ASPART (70-30 MIX) 100 UNIT/ML ~~LOC~~ SUSP: SUBCUTANEOUS | Status: DC

## 2011-12-20 MED ORDER — INSULIN PEN NEEDLE 32G X 4 MM MISC: 1.0000 | Freq: Two times a day (BID) | Status: DC

## 2011-12-20 MED ORDER — OLMESARTAN MEDOXOMIL 20 MG PO TABS: 20.0000 mg | ORAL_TABLET | Freq: Every day | ORAL | Status: DC

## 2011-12-20 NOTE — Discharge Summary (Signed)
Patient ID: Rodney Gregory MRN: [id number] DOB/AGE: [birth year] 50 y.o.  Admit date: 12/18/2011 Discharge date: 12/20/2011  Primary Care Physician:  Mickie Hillier, MD, MD   Discharge Diagnoses:    Present on Admission:  *Newly diagnosed Diabetes mellitus .Type 2 diabetes mellitus with hyperosmolar nonketotic hyperglycemia .Hyponatremia .ARF (acute renal failure) .Dehydration .HTN (hypertension) .Hyperlipidemia .CAD (coronary artery disease)  Current Discharge Medication List    START taking these medications   Details  insulin aspart protamine-insulin aspart (NOVOLOG 70/30) (70-30) 100 UNIT/ML injection Novolog  70/30 PEN  Inject 20 units [place] in the AM  ,25 units [place]  in the evening Qty: 15 mL, Refills: 0    Insulin Pen Needle 32G X 4 MM MISC 1 each by Does not apply route 2 (two) times daily. Qty: 100 each, Refills: 0    olmesartan (BENICAR) 20 MG tablet Take 1 tablet (20 mg total) by mouth daily. Qty: 30 tablet, Refills: 0      CONTINUE these medications which have NOT CHANGED   Details  carvedilol (COREG) 6.25 MG tablet Take 6.25 mg by mouth 2 (two) times daily with a meal.      rosuvastatin (CRESTOR) 10 MG tablet Take 10 mg by mouth daily.        STOP taking these medications     olmesartan-hydrochlorothiazide (BENICAR HCT) 20-12.5 MG per tablet          Consults:  Diabetic coordinator  Significant Diagnostic Studies:  X-ray Chest Pa And Lateral   12/18/2011  *RADIOLOGY REPORT*  Clinical Data: Pneumonia  CHEST - 2 VIEW  Comparison: None.  Findings: Sternotomy wires overlie stable heart silhouette. Costophrenic angles are clear.  No effusion, infiltrate, or pneumothorax Degenerative osteophytosis of the thoracic spine.  IMPRESSION: No acute cardiopulmonary process.  Original Report Authenticated By: Genevive Bi, M.D.    Brief H and P: For complete details please refer to admission H and P, but in brief Mr. Lucken is a pleasant 50 year old African  American gentleman with a history of coronary artery disease status post PCI with bare-metal stents to the LAD, history of non-ST elevated MI 01/24/2000, a history of CABG x3 in March 2011, history of hypertension hyperlipidemia and history of tobacco abuse who presents to the ED with a five-day history of polyuria ,polydipsia, fatigue, and generalized weakness, blurry vision. Patient denies any fevers no chest pain, no shortness of breath, no nausea, no vomiting, no fever, no abdominal pain, no diarrhea, no constipation, no dysuria. As soon emergency room and his blood glucose was noted to be about 600. A beam that which was obtained showed a sodium level of 128 a BUN/creatinine of 29/1.43. CBC which was obtained was within normal limits. Patient was placed on IV fluids and the glucose stabilized we will call to admit the patient for further evaluation and management.    Hospital Course:  Principal Problem:  *Type 2 diabetes mellitus with hyperosmolar nonketotic hyperglycemia HBA1C of 14.6% Patient  Was initially treated with glucose stabilizer ,then transition to lantus insulin  , patient was seen by diabetic educator and he preferred insulin 70/30 rather than Lantus, he was initially placed on 20 units  of insulin 70 /30 twice a day and that was adjusted to 20 units every morning and 25 units every afternoon based on CBGs monitoring. Patient was given a prescription for a home glucose meter and was trained on use of insulin injections and advised  To  check his glucose before meals and at bed time  and record readings in a log book to take to his PCP office . Hyponatremia  Secondary to hyperglycemia, resolved  ARF (acute renal failure)  Secondary to dehydration, resolved  HTN (hypertension)  benicar HCT was held on admission because of AKI ,he was kept on coreg only ,his SBP was ranging between 101-120. I will resume Benicar  only  Continue along with coreg ,however will discontinue HCTZ .monitor K  level while on benicar . Hyperlipidemia  Continue Crestor  CAD (coronary artery disease) S/P CABG x 3 No acute issues    subjective: Patient seen and examined, he denies any complaints and eager  to go home    Filed Vitals:   12/20/11 0532  BP: 123/81  Pulse: 60  Temp: 98.1 F (36.7 C)  Resp: 20    General: Alert, awake, oriented x3, in no acute distress. HEENT: No bruits, no goiter. Heart: Regular rate and rhythm, without murmurs, rubs, gallops. Lungs: Clear to auscultation bilaterally. Abdomen: Soft, nontender, nondistended, positive bowel sounds. Extremities: No clubbing cyanosis or edema with positive pedal pulses. Neuro: Grossly intact, nonfocal.   Disposition and Follow-up: To home   follow his PCP ASAP.    Time spent on Discharge: 50 minutes    Signed: Jolana Runkles 12/20/2011, 12:00 PM

## 2011-12-20 NOTE — Progress Notes (Signed)
IV removed, site unremarkable.  Discharge instructions, follow-up appointments, and prescriptions given.  Pt given diabetes education materials and starter kits.  All belongings sent with pt and all questions answered.  Pt discharged home with family by private vehicle.

## 2011-12-20 NOTE — Progress Notes (Signed)
Nutrition Follow-up and  Reconsult   Patient seen on 12/26 for new onset DM diet education. Spoke with patient this morning regarding diet education, patient has no questions at this time.  PO intake 90-100%.   Clarene Duke MARIE Pager # [phone]

## 2011-12-20 NOTE — Progress Notes (Signed)
   CARE MANAGEMENT NOTE 12/20/2011  Patient:  DERRION, TRITZ   Account Number:  [id number]  Date Initiated:  12/19/2011  Documentation initiated by:  Junius Creamer  Subjective/Objective Assessment:   adm w diabetes outof control     Action/Plan:   lives w wife, pcp dr Caryn Bee little   Anticipated DC Date:  12/21/2011   Anticipated DC Plan:  HOME/SELF CARE      DC Planning Services  CM consult      Choice offered to / List presented to:             Status of service:   Medicare Important Message given?   (If response is "NO", the following Medicare IM given date fields will be blank) Date Medicare IM given:   Date Additional Medicare IM given:    Discharge Disposition:  HOME/SELF CARE  Per UR Regulation:  Reviewed for med. necessity/level of care/duration of stay  Comments:  12/26 debbie Phillipa Morden rn,bsn [phone]

## 2011-12-20 NOTE — Progress Notes (Signed)
Inpatient Diabetes Program Recommendations  AACE/ADA: New Consensus Statement on Inpatient Glycemic Control (2009)  Target Ranges:  Prepandial:   less than 140 mg/dL      Peak postprandial:   less than 180 mg/dL (1-2 hours)      Critically ill patients:  140 - 180 mg/dL   Reason for Visit: Post-prandial hyperglycemia CBG=327  Inpatient Diabetes Program Recommendations Insulin - Basal: Increase 70/30 to 25 units with supper Outpatient Referral: Order for outpatient education at [facility])  Piedad Climes RN, Diabetes Coordinator

## 2011-12-20 NOTE — Progress Notes (Signed)
12/27 Visit with patient this afternoon to make sure he is comfortable with insulin instructions and technique.  He reports having given his own insulin 3-4 times now.  He asked about hypoglycemia and how to treat.  I answered his question instructing him on the 15/15 rule.  He will monitor his CBGs 4xdaily and record for his PCP.  Patient seems confident about his self-management for this new diagnosis.  He will follow-up soon with his PCP, Dr. Clarene Duke. Thanks Piedad Climes RN, BSN, CDE Inpatient Diabetes Coordinator

## 2012-02-11 ENCOUNTER — Other Ambulatory Visit: Payer: Self-pay | Admitting: Internal Medicine

## 2012-03-18 DIAGNOSIS — Z0279 Encounter for issue of other medical certificate: Secondary | ICD-10-CM

## 2012-07-24 ENCOUNTER — Other Ambulatory Visit: Payer: Self-pay | Admitting: Family Medicine

## 2012-07-24 DIAGNOSIS — R748 Abnormal levels of other serum enzymes: Secondary | ICD-10-CM

## 2012-08-04 ENCOUNTER — Ambulatory Visit
Admission: RE | Admit: 2012-08-04 | Discharge: 2012-08-04 | Disposition: A | Discharge status: Home / Self Care | Payer: Federal, State, Local not specified - PPO | Source: Ambulatory Visit | Attending: Family Medicine | Admitting: Family Medicine

## 2012-08-04 DIAGNOSIS — R748 Abnormal levels of other serum enzymes: Secondary | ICD-10-CM

## 2014-01-28 ENCOUNTER — Telehealth: Payer: Self-pay | Admitting: *Deleted

## 2014-01-28 NOTE — Telephone Encounter (Signed)
Patient states that he is out of his carvedilol, he took his last pill this am. He would like it sent to [facility] on [address]. Thanks, MI

## 2014-01-29 MED ORDER — CARVEDILOL 6.25 MG PO TABS: 6.2500 mg | ORAL_TABLET | Freq: Two times a day (BID) | ORAL | Status: DC

## 2014-01-29 NOTE — Telephone Encounter (Signed)
Done

## 2014-03-25 ENCOUNTER — Other Ambulatory Visit: Payer: Self-pay | Admitting: *Deleted

## 2014-03-25 DIAGNOSIS — E782 Mixed hyperlipidemia: Secondary | ICD-10-CM

## 2014-04-05 ENCOUNTER — Other Ambulatory Visit: Payer: Federal, State, Local not specified - PPO

## 2014-04-09 ENCOUNTER — Other Ambulatory Visit: Payer: Self-pay

## 2014-04-09 MED ORDER — CARVEDILOL 6.25 MG PO TABS: 6.2500 mg | ORAL_TABLET | Freq: Two times a day (BID) | ORAL | Status: DC

## 2014-04-22 ENCOUNTER — Other Ambulatory Visit: Payer: Self-pay

## 2014-04-22 MED ORDER — ROSUVASTATIN CALCIUM 10 MG PO TABS: 10.0000 mg | ORAL_TABLET | Freq: Every day | ORAL | Status: DC

## 2014-06-21 ENCOUNTER — Other Ambulatory Visit: Payer: Self-pay | Admitting: Interventional Cardiology

## 2014-07-26 ENCOUNTER — Other Ambulatory Visit: Payer: Self-pay | Admitting: *Deleted

## 2014-07-26 ENCOUNTER — Other Ambulatory Visit: Payer: Self-pay | Admitting: Interventional Cardiology

## 2014-07-26 MED ORDER — ROSUVASTATIN CALCIUM 10 MG PO TABS: ORAL_TABLET | ORAL | Status: DC

## 2014-09-20 ENCOUNTER — Other Ambulatory Visit: Payer: Federal, State, Local not specified - PPO

## 2014-09-22 ENCOUNTER — Other Ambulatory Visit (INDEPENDENT_AMBULATORY_CARE_PROVIDER_SITE_OTHER): Payer: Federal, State, Local not specified - PPO

## 2014-09-22 DIAGNOSIS — E782 Mixed hyperlipidemia: Secondary | ICD-10-CM

## 2014-09-22 LAB — LIPID PANEL
CHOLESTEROL: 107 mg/dL (ref 0–200)
HDL: 26 mg/dL — ABNORMAL LOW (ref >39.00)
LDL CALC: 63 mg/dL (ref 0–99)
NonHDL: 81
TRIGLYCERIDES: 91 mg/dL (ref 0.0–149.0)
Total CHOL/HDL Ratio: 4
VLDL: 18.2 mg/dL (ref 0.0–40.0)

## 2014-09-22 LAB — HEPATIC FUNCTION PANEL
ALT: 79 U/L — AB (ref 0–53)
AST: 48 U/L — AB (ref 0–37)
Albumin: 4.2 g/dL (ref 3.5–5.2)
Alkaline Phosphatase: 47 U/L (ref 39–117)
Bilirubin, Direct: 0 mg/dL (ref 0.0–0.3)
TOTAL PROTEIN: 8 g/dL (ref 6.0–8.3)
Total Bilirubin: 0.5 mg/dL (ref 0.2–1.2)

## 2014-09-27 ENCOUNTER — Encounter: Payer: Self-pay | Admitting: Interventional Cardiology

## 2014-09-27 ENCOUNTER — Other Ambulatory Visit: Payer: Self-pay

## 2014-09-27 ENCOUNTER — Ambulatory Visit (INDEPENDENT_AMBULATORY_CARE_PROVIDER_SITE_OTHER): Payer: Federal, State, Local not specified - PPO | Admitting: Interventional Cardiology

## 2014-09-27 VITALS — BP 130/90 | HR 68 | Ht 68.5 in | Wt 214.1 lb

## 2014-09-27 DIAGNOSIS — Z951 Presence of aortocoronary bypass graft: Secondary | ICD-10-CM

## 2014-09-27 DIAGNOSIS — E785 Hyperlipidemia, unspecified: Secondary | ICD-10-CM

## 2014-09-27 DIAGNOSIS — I25810 Atherosclerosis of coronary artery bypass graft(s) without angina pectoris: Secondary | ICD-10-CM

## 2014-09-27 DIAGNOSIS — I10 Essential (primary) hypertension: Secondary | ICD-10-CM

## 2014-09-27 MED ORDER — ROSUVASTATIN CALCIUM 10 MG PO TABS: ORAL_TABLET | ORAL | Status: DC

## 2014-09-27 MED ORDER — CARVEDILOL 6.25 MG PO TABS: 6.2500 mg | ORAL_TABLET | Freq: Two times a day (BID) | ORAL | Status: DC

## 2014-09-27 NOTE — Patient Instructions (Signed)
Your physician recommends that you continue on your current medications as directed. Please refer to the Current Medication list given to you today.  An Rx for Crestor and Carvedilol has been sent to your pharmacy  Your physician discussed the importance of regular exercise and recommended that you start or continue a regular exercise program for good health.   Your physician wants you to follow-up in: 1 year with Dr.Smith You will receive a reminder letter in the mail two months in advance. If you don't receive a letter, please call our office to schedule the follow-up appointment.

## 2014-09-27 NOTE — Progress Notes (Signed)
Patient ID: Rodney Gregory, male   DOB: July 25, 1961, 53 y.o.   MRN: [id number]    [address] [place], [place]  [address] Phone: [phone] Fax:  [phone]  Date:  09/27/2014   ID:  Rodney Gregory, DOB July 25, 1961, MRN [id number]  PCP:  Mickie HillierLITTLE,KEVIN LORNE, MD   ASSESSMENT:  1. Coronary artery disease, status post coronary bypass grafting, asymptomatic 2. Hypertension with borderline control. Has recently been off carvedilol because he ran out of refills. 3. Hyperlipidemia, followed and managed by primary care Dr. Clarene DukeLittle. Recently off Crestor because he ran out of refills   PLAN:  1. refill both carvedilol and Crestor 2. Make an appointment with Dr. Clarene DukeLittle for laboratory workup and longitudinal care 3. Followup with me in one year. Earlier if cardiovascular symptoms. 4. Aerobic activity to help with weight, diabetes, lipids, and blood pressure.   SUBJECTIVE: Rodney Gregory is a 53 y.o. male who is doing well. He denies angina. He still hasn't left parasternal numbness. He has been off Crestor and carvedilol because his refills expired. He denies edema, palpitations, syncope, angina, and PND. No neurological complaints. He denies claudication. He is gaining weight.   Wt Readings from Last 3 Encounters:  09/27/14 214 lb 1.9 oz (97.124 kg)  12/20/11 204 lb 5.9 oz (92.7 kg)     Past Medical History  Diagnosis Date  . Hypertension   . Anemia     hx of blood loss anemia  . Myocardial infarction   . Hyperlipidemia   . Coronary artery disease     Current Outpatient Prescriptions  Medication Sig Dispense Refill  . metFORMIN (GLUCOPHAGE) 850 MG tablet Take 850 mg by mouth 2 (two) times daily with a meal.      . olmesartan (BENICAR) 20 MG tablet Take 1 tablet (20 mg total) by mouth daily.  30 tablet  0  . carvedilol (COREG) 6.25 MG tablet Take 1 tablet (6.25 mg total) by mouth 2 (two) times daily with a meal.  60 tablet  0  . insulin aspart protamine-insulin aspart  (NOVOLOG 70/30) (70-30) 100 UNIT/ML injection Novolog  70/30 PEN  Inject 20 units [place] in the AM  ,25 units [place]  in the evening  15 mL  0  . Insulin Pen Needle 32G X 4 MM MISC 1 each by Does not apply route 2 (two) times daily.  100 each  0  . rosuvastatin (CRESTOR) 10 MG tablet TAKE 1 TABLET BY MOUTH EVERY DAY  30 tablet  1   No current facility-administered medications for this visit.    Allergies:    Allergies  Allergen Reactions  . Ace Inhibitors   . Imdur [Isosorbide Mononitrate] Other (See Comments)    Chest pain    Social History:  The patient  reports that he has been smoking Cigarettes.  He has been smoking about 0.00 packs per day. He does not have any smokeless tobacco history on file. He reports that he does not drink alcohol or use illicit drugs.   ROS:  Please see the history of present illness.   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 There is no abdominal swelling. He denies cough and orthopnea.   All other systems reviewed and negative.   OBJECTIVE: VS:  BP 130/90  Pulse 68  Ht 5' 8.5" (1.74 m)  Wt 214 lb 1.9 oz (97.124 kg)  BMI 32.08 kg/m2 Well nourished, well developed, in no acute distress,  young-appearing HEENT: normal Neck: JVD flat. Carotid bruit absent  Cardiac:  normal S1, S2; RRR; no murmur Lungs:  clear to auscultation bilaterally, no wheezing, rhonchi or rales Abd: soft, nontender, no hepatomegaly Ext: Edema absent. Pulses 2+ bilateral Skin: warm and dry Neuro:  CNs 2-12 intact, no focal abnormalities noted  EKG:  Normal sinus rhythm, anteroseptal myocardial infarction, prominent voltage, left atrial abnormality.       Signed, Darci Needle III, MD 09/27/2014 9:33 AM

## 2014-12-27 ENCOUNTER — Ambulatory Visit
Admission: RE | Admit: 2014-12-27 | Discharge: 2014-12-27 | Disposition: A | Discharge status: Home / Self Care | Payer: Federal, State, Local not specified - PPO | Source: Ambulatory Visit | Attending: Family Medicine | Admitting: Family Medicine

## 2014-12-27 ENCOUNTER — Other Ambulatory Visit: Payer: Self-pay | Admitting: Family Medicine

## 2014-12-27 DIAGNOSIS — R1084 Generalized abdominal pain: Secondary | ICD-10-CM

## 2014-12-27 MED ORDER — IOHEXOL 300 MG/ML  SOLN
125.0000 mL | Freq: Once | INTRAMUSCULAR | Status: AC | PRN
  Administered 2014-12-27: 125 mL via INTRAVENOUS

## 2015-01-02 ENCOUNTER — Other Ambulatory Visit: Payer: Self-pay | Admitting: Interventional Cardiology

## 2015-03-10 ENCOUNTER — Other Ambulatory Visit: Payer: Self-pay

## 2015-03-10 DIAGNOSIS — I10 Essential (primary) hypertension: Secondary | ICD-10-CM

## 2015-03-10 MED ORDER — CARVEDILOL 6.25 MG PO TABS
6.2500 mg | ORAL_TABLET | Freq: Two times a day (BID) | ORAL | Status: DC
Start: 2015-03-10 — End: 2015-10-21

## 2015-03-16 ENCOUNTER — Other Ambulatory Visit: Payer: Self-pay | Admitting: Interventional Cardiology

## 2015-09-04 ENCOUNTER — Encounter (HOSPITAL_COMMUNITY): Payer: Self-pay

## 2015-09-04 ENCOUNTER — Emergency Department (HOSPITAL_COMMUNITY)
Admission: EM | Admit: 2015-09-04 | Discharge: 2015-09-04 | Disposition: A | Discharge status: Home / Self Care | Payer: No Typology Code available for payment source | Attending: Emergency Medicine | Admitting: Emergency Medicine

## 2015-09-04 DIAGNOSIS — Z951 Presence of aortocoronary bypass graft: Secondary | ICD-10-CM | POA: Insufficient documentation

## 2015-09-04 DIAGNOSIS — E785 Hyperlipidemia, unspecified: Secondary | ICD-10-CM | POA: Insufficient documentation

## 2015-09-04 DIAGNOSIS — L089 Local infection of the skin and subcutaneous tissue, unspecified: Secondary | ICD-10-CM | POA: Diagnosis present

## 2015-09-04 DIAGNOSIS — I2510 Atherosclerotic heart disease of native coronary artery without angina pectoris: Secondary | ICD-10-CM | POA: Insufficient documentation

## 2015-09-04 DIAGNOSIS — Z862 Personal history of diseases of the blood and blood-forming organs and certain disorders involving the immune mechanism: Secondary | ICD-10-CM | POA: Insufficient documentation

## 2015-09-04 DIAGNOSIS — I252 Old myocardial infarction: Secondary | ICD-10-CM | POA: Insufficient documentation

## 2015-09-04 DIAGNOSIS — Z794 Long term (current) use of insulin: Secondary | ICD-10-CM | POA: Insufficient documentation

## 2015-09-04 DIAGNOSIS — Z720 Tobacco use: Secondary | ICD-10-CM | POA: Insufficient documentation

## 2015-09-04 DIAGNOSIS — I10 Essential (primary) hypertension: Secondary | ICD-10-CM | POA: Insufficient documentation

## 2015-09-04 DIAGNOSIS — L03011 Cellulitis of right finger: Secondary | ICD-10-CM

## 2015-09-04 DIAGNOSIS — Z79899 Other long term (current) drug therapy: Secondary | ICD-10-CM | POA: Diagnosis not present

## 2015-09-04 MED ORDER — LIDOCAINE HCL (PF) 1 % IJ SOLN
30.0000 mL | Freq: Once | INTRAMUSCULAR | Status: AC
  Administered 2015-09-04: 30 mL
  Filled 2015-09-04: qty 30

## 2015-09-04 MED ORDER — CEPHALEXIN 500 MG PO CAPS: 500.0000 mg | ORAL_CAPSULE | Freq: Four times a day (QID) | ORAL | Status: DC

## 2015-09-04 NOTE — ED Provider Notes (Signed)
CSN: [id number]     Arrival date & time 09/04/15  1325 History   First MD Initiated Contact with Patient 09/04/15 1600     Chief Complaint  Patient presents with  . Finger Injury     (Consider location/radiation/quality/duration/timing/severity/associated sxs/prior Treatment) HPI Comments: Patient presents to the emergency department with chief complaint of finger tip infection. Patient states that he was running some cable underneath the carpet and hit his fingertips of his right hand on the tack strip. Patient states that he has had some pain and swelling of his right distal index and middle finger. He denies any fevers or chills. Patient is a diabetic, but states that his blood sugar has been well controlled and is running in the 100s. His symptoms are aggravated with palpation. There are no alleviating factors. He has not taken anything for his symptoms.  The history is provided by the patient. No language interpreter was used.    Past Medical History  Diagnosis Date  . Hypertension   . Anemia     hx of blood loss anemia  . Myocardial infarction   . Hyperlipidemia   . Coronary artery disease    Past Surgical History  Procedure Laterality Date  . Back surgery    . Coronary artery bypass graft  02/2010  . Rotator cuff repair  2009    right   Family History  Problem Relation Age of Onset  . Heart attack Mother   . Hypertension Mother   . Diabetes Mother   . Hypertension Father    Social History  Substance Use Topics  . Smoking status: Current Some Day Smoker    Types: Cigarettes  . Smokeless tobacco: None  . Alcohol Use: No    Review of Systems  Constitutional: Negative for fever and chills.  Respiratory: Negative for shortness of breath.   Cardiovascular: Negative for chest pain.  Gastrointestinal: Negative for nausea, vomiting, diarrhea and constipation.  Genitourinary: Negative for dysuria.  Skin: Positive for wound.      Allergies  Ace inhibitors and  Imdur  Home Medications   Prior to Admission medications   Medication Sig Start Date End Date Taking? Authorizing Provider  carvedilol (COREG) 6.25 MG tablet Take 1 tablet (6.25 mg total) by mouth 2 (two) times daily with a meal. 03/10/15   [name], MD  cephALEXin (KEFLEX) 500 MG capsule Take 1 capsule (500 mg total) by mouth 4 (four) times daily. 09/04/15   Roxy Horseman, PA-C  insulin aspart protamine-insulin aspart (NOVOLOG 70/30) (70-30) 100 UNIT/ML injection Novolog  70/30 PEN  Inject 20 units [place] in the AM  ,25 units [place]  in the evening 12/20/11   Antonieta Pert, MD  Insulin Pen Needle 32G X 4 MM MISC 1 each by Does not apply route 2 (two) times daily. 12/20/11   Antonieta Pert, MD  metFORMIN (GLUCOPHAGE) 850 MG tablet Take 850 mg by mouth 2 (two) times daily with a meal.    Historical Provider, MD  olmesartan (BENICAR) 20 MG tablet Take 1 tablet (20 mg total) by mouth daily. 12/20/11 09/27/14  Antonieta Pert, MD  rosuvastatin (CRESTOR) 10 MG tablet TAKE 1 TABLET BY MOUTH EVERY DAY 09/27/14   [name], MD   BP 117/82 mmHg  Pulse 62  Temp(Src) 98.2 F (36.8 C) (Oral)  Resp 16  SpO2 100% Physical Exam  Constitutional: He is oriented to person, place, and time. He appears well-developed and well-nourished.  HENT:  Head: Normocephalic  and atraumatic.  Eyes: Conjunctivae and EOM are normal.  Neck: Normal range of motion.  Cardiovascular: Normal rate.   Pulmonary/Chest: Effort normal.  Abdominal: He exhibits no distension.  Musculoskeletal: Normal range of motion.  Neurological: He is alert and oriented to person, place, and time.  Skin: Skin is dry.  Paronychia of right middle finger ulnar aspect, possible paronychia, no evidence of felon  Psychiatric: He has a normal mood and affect. His behavior is normal. Judgment and thought content normal.  Nursing note and vitals reviewed.   ED Course  Procedures (including critical care time)  INCISION AND  DRAINAGE Performed by: Roxy Horseman Consent: Verbal consent obtained. Risks and benefits: risks, benefits and alternatives were discussed Type: abscess  Body area: Right distal fingertip  Anesthesia: Digital block  Incision was made with a scalpel.   digital block : lidocaine 2 % without epinephrine  Anesthetic total: 4 ml  Complexity: complex Blunt dissection to break up loculations  Drainage: purulent  Drainage amount: Moderate   Packing material: Not packed   Patient tolerance: Patient tolerated the procedure well with no immediate complications.   INCISION AND DRAINAGE Performed by: Roxy Horseman Consent: Verbal consent obtained. Risks and benefits: risks, benefits and alternatives were discussed Type: abscess  Body area: Right index finger  Anesthesia: Digital block  Incision was made with a scalpel.  Local anesthetic: lidocaine 2 % without epinephrine  Anesthetic total: 4 ml  Complexity: complex Blunt dissection to break up loculations  Drainage: purulent  Drainage amount: Minimal   Packing material: Not packed   Patient tolerance: Patient tolerated the procedure well with no immediate complications.     MDM   Final diagnoses:  Paronychia, right    Patient with paronychia of right middle finger and possible paronychia of right index finger. Incision and drainage was attempted on both fingertips. The right middle finger drained moderate amount of purulent discharge. There is only minimal drainage from the right index finger. I will start the patient on Keflex. I discussed the patient with Dr. Madilyn Hook, who agrees with this plan. Very close return precautions given. Patient advised that these infections can spread quickly and he needs to monitor his fingertips closely especially given the fact that he is a diabetic. Patient understands and agrees with the plan. He is stable and ready for discharge.    Roxy Horseman, PA-C 09/04/15  1902  Tilden Fossa, MD 09/05/15 [phone]

## 2015-09-04 NOTE — ED Notes (Signed)
Pt called twice for triage room placement with no answer

## 2015-09-04 NOTE — ED Notes (Signed)
Pt reports onset several weeks ago was running hand under carpet to fix something and right middle and index finger got punctured with some nails.  Pain is extending up fingers into top of hand.

## 2015-09-04 NOTE — Discharge Instructions (Signed)

## 2015-10-21 ENCOUNTER — Other Ambulatory Visit: Payer: Self-pay | Admitting: Interventional Cardiology

## 2015-10-21 MED ORDER — CARVEDILOL 6.25 MG PO TABS: 6.2500 mg | ORAL_TABLET | Freq: Two times a day (BID) | ORAL | Status: DC

## 2016-01-04 ENCOUNTER — Ambulatory Visit: Payer: No Typology Code available for payment source | Admitting: Physician Assistant

## 2016-01-06 ENCOUNTER — Ambulatory Visit: Payer: No Typology Code available for payment source | Admitting: Physician Assistant

## 2016-01-22 NOTE — Progress Notes (Signed)
Cardiology Office Note:    Date:  01/23/2016   ID:  Rodney Gregory, DOB 1961/03/09, MRN [id number]  PCP:  Rodney Hillier, MD  Cardiologist:  Dr. Verdis Prime   Electrophysiologist:  n/a  Chief Complaint  Patient presents with  . Coronary Artery Disease    Follow-up    History of Present Illness:    Rodney Gregory is a 55 y.o. male with a hx of CAD s/p CABG in 2011, HTN, HL. Last seen by Dr. Verdis Prime in 10/15.  Returns for FU.      Past Medical History  Diagnosis Date  . Hypertension   . Anemia     hx of blood loss anemia  . Myocardial infarction (HCC)   . Hyperlipidemia   . Coronary artery disease   . Diabetes mellitus 12/18/2011    Past Surgical History  Procedure Laterality Date  . Back surgery    . Coronary artery bypass graft  02/2010  . Rotator cuff repair  2009    right    Current Medications: Outpatient Prescriptions Prior to Visit  Medication Sig Dispense Refill  . carvedilol (COREG) 6.25 MG tablet Take 1 tablet (6.25 mg total) by mouth 2 (two) times daily with a meal. 90 tablet 0  . cephALEXin (KEFLEX) 500 MG capsule Take 1 capsule (500 mg total) by mouth 4 (four) times daily. 28 capsule 0  . insulin aspart protamine-insulin aspart (NOVOLOG 70/30) (70-30) 100 UNIT/ML injection Novolog  70/30 PEN  Inject 20 units [place] in the AM  ,25 units [place]  in the evening 15 mL 0  . Insulin Pen Needle 32G X 4 MM MISC 1 each by Does not apply route 2 (two) times daily. 100 each 0  . metFORMIN (GLUCOPHAGE) 850 MG tablet Take 850 mg by mouth 2 (two) times daily with a meal.    . olmesartan (BENICAR) 20 MG tablet Take 1 tablet (20 mg total) by mouth daily. 30 tablet 0  . rosuvastatin (CRESTOR) 10 MG tablet TAKE 1 TABLET BY MOUTH EVERY DAY 30 tablet 5   No facility-administered medications prior to visit.     Allergies:   Ace inhibitors and Imdur   Social History   Social History  . Marital Status: Married    Spouse Name: N/A  . Number of Children: N/A  . Years  of Education: N/A   Social History Main Topics  . Smoking status: Current Some Day Smoker    Types: Cigarettes  . Smokeless tobacco: Not on file  . Alcohol Use: No  . Drug Use: No  . Sexual Activity: Yes   Other Topics Concern  . Not on file   Social History Narrative     Family History:  The patient's family history includes Diabetes in his mother; Heart attack in his mother; Hypertension in his father and mother.   ROS:   Please see the history of present illness.    ROS All other systems reviewed and are negative.   Physical Exam:    VS:  There were no vitals taken for this visit.   GEN: Well nourished, well developed, in no acute distress HEENT: normal Neck: no JVD, no masses Cardiac: Normal S1/S2, RRR; no murmurs, rubs, or gallops, no edema;   carotid bruits,   Respiratory:  clear to auscultation bilaterally; no wheezing, rhonchi or rales GI: soft, nontender, nondistended, + BS MS: no deformity or atrophy Skin: warm and dry, no rash Neuro:  Bilateral strength equal, no focal  deficits  Psych: Alert and oriented x 3, normal affect  Wt Readings from Last 3 Encounters:  09/27/14 214 lb 1.9 oz (97.124 kg)  12/20/11 204 lb 5.9 oz (92.7 kg)      Studies/Labs Reviewed:    EKG:  EKG is  ordered today.  The ekg ordered today demonstrates   Recent Labs: No results found for requested labs within last 365 days.   Recent Lipid Panel    Component Value Date/Time   CHOL 107 09/22/2014 0948   TRIG 91.0 09/22/2014 0948   HDL 26.00* 09/22/2014 0948   CHOLHDL 4 09/22/2014 0948   VLDL 18.2 09/22/2014 0948   LDLCALC 63 09/22/2014 0948    Additional studies/ records that were reviewed today include:   LHC 5/11 >> CABG LM patent LAD prox stent 80% ISR LCx mid 70-80% RCA patent EF 40%  Echo 5/11 Mod LVH, EF 60-65%, inf and apical severe HK, ant-septal mod HK, Gr 1 DD   ASSESSMENT:    1. Coronary artery disease involving native coronary artery of native heart  without angina pectoris   2. Essential hypertension   3. Hyperlipidemia     PLAN:    In order of problems listed above:  1. CAD -   2. HTN -   3. HL -      Medication Adjustments/Labs and Tests Ordered: Current medicines are reviewed at length with the patient today.  Concerns regarding medicines are outlined above.  Medication changes, Labs and Tests ordered today are outlined in the Patient Instructions noted below. There are no Patient Instructions on file for this visit.   Signed, Tereso Newcomer, PA-C  01/23/2016 8:04 AM    [facility] [address] [place], [place], [place]  [address] Phone: [phone]; Fax: [phone]     This encounter was created in error - please disregard.

## 2016-01-23 ENCOUNTER — Encounter: Payer: Self-pay | Admitting: Physician Assistant

## 2016-01-23 ENCOUNTER — Encounter: Payer: No Typology Code available for payment source | Admitting: Physician Assistant

## 2016-01-31 NOTE — Progress Notes (Signed)
Cardiology Office Note:    Date:  02/01/2016   ID:  Rodney Gregory, DOB [birth year], MRN [id number]  PCP:  Redmond Baseman, MD  Cardiologist:  Dr. Verdis Prime   Electrophysiologist:  n/a  Chief Complaint  Patient presents with  . Coronary Artery Disease    Follow-up     History of Present Illness:     Rodney Gregory is a 55 y.o. male with a hx of CAD s/p CABG in 2011, HTN, HL. Last seen by Dr. Verdis Prime in 10/15. Returns for FU. He is overall stable. He does get chest tightness and dyspnea when he overdoes it. He has experienced this since his bypass. He does not feel that it is getting any worse. He tries to limit his activities to prevent the symptoms. Denies orthopnea, PND or edema. Denies syncope. Denies any bleeding issues.    Past Medical History  Diagnosis Date  . Hypertension   . Anemia     hx of blood loss anemia  . Myocardial infarction (HCC)   . Hyperlipidemia   . Coronary artery disease   . Diabetes mellitus 12/18/2011    Past Surgical History  Procedure Laterality Date  . Back surgery    . Coronary artery bypass graft  02/2010  . Rotator cuff repair  2009    right    Current Medications: Outpatient Prescriptions Prior to Visit  Medication Sig Dispense Refill  . carvedilol (COREG) 6.25 MG tablet Take 1 tablet (6.25 mg total) by mouth 2 (two) times daily with a meal. 90 tablet 0  . cephALEXin (KEFLEX) 500 MG capsule Take 1 capsule (500 mg total) by mouth 4 (four) times daily. (Patient not taking: Reported on 02/01/2016) 28 capsule 0  . insulin aspart protamine-insulin aspart (NOVOLOG 70/30) (70-30) 100 UNIT/ML injection Novolog  70/30 PEN  Inject 20 units [place] in the AM  ,25 units [place]  in the evening (Patient not taking: Reported on 02/01/2016) 15 mL 0  . Insulin Pen Needle 32G X 4 MM MISC 1 each by Does not apply route 2 (two) times daily. (Patient not taking: Reported on 02/01/2016) 100 each 0  . metFORMIN (GLUCOPHAGE) 850 MG tablet Take 850 mg by mouth 2  (two) times daily with a meal. Reported on 02/01/2016    . olmesartan (BENICAR) 20 MG tablet Take 1 tablet (20 mg total) by mouth daily. 30 tablet 0  . rosuvastatin (CRESTOR) 10 MG tablet TAKE 1 TABLET BY MOUTH EVERY DAY (Patient not taking: Reported on 02/01/2016) 30 tablet 5   No facility-administered medications prior to visit.     Allergies:   Ace inhibitors; Imdur; and Isosorbide   Social History   Social History  . Marital Status: Married    Spouse Name: N/A  . Number of Children: N/A  . Years of Education: N/A   Social History Main Topics  . Smoking status: Current Some Day Smoker    Types: Cigarettes  . Smokeless tobacco: None  . Alcohol Use: No  . Drug Use: No  . Sexual Activity: Yes   Other Topics Concern  . None   Social History Narrative     Family History:  The patient's family history includes Diabetes in his mother; Heart attack in his mother; Hypertension in his father and mother.   ROS:   Please see the history of present illness.    Review of Systems  Eyes: Positive for visual disturbance.  Musculoskeletal: Positive for back pain.  Gastrointestinal: Positive for  abdominal pain.  Neurological: Positive for dizziness and loss of balance.  All other systems reviewed and are negative.   Physical Exam:    VS:  BP 140/84 mmHg  Pulse 70  Ht 5' 8.5" (1.74 m)  Wt 210 lb (95.255 kg)  BMI 31.46 kg/m2   GEN: Well nourished, well developed, in no acute distress HEENT: normal Neck: no JVD, no masses Cardiac: Normal S1/S2, RRR; no murmurs,  no edema;  no carotid bruits,   Respiratory:  clear to auscultation bilaterally; no wheezing, rhonchi or rales GI: soft, nontender, nondistended, + BS MS: no deformity or atrophy Skin: warm and dry, no rash Neuro: no focal deficits  Psych: Alert and oriented x 3, normal affect  Wt Readings from Last 3 Encounters:  02/01/16 210 lb (95.255 kg)  09/27/14 214 lb 1.9 oz (97.124 kg)  12/20/11 204 lb 5.9 oz (92.7 kg)       Studies/Labs Reviewed:     EKG:  EKG is  ordered today.  The ekg ordered today demonstrates NSR, HR 70, normal axis, T-wave inversions 1, aVL, V4-V6, QTc 384 ms, PVC  Recent Labs: No results found for requested labs within last 365 days.   Recent Lipid Panel    Component Value Date/Time   CHOL 107 09/22/2014 0948   TRIG 91.0 09/22/2014 0948   HDL 26.00* 09/22/2014 0948   CHOLHDL 4 09/22/2014 0948   VLDL 18.2 09/22/2014 0948   LDLCALC 63 09/22/2014 0948    Additional studies/ records that were reviewed today include:   LHC 5/11 >> CABG LM patent LAD prox stent 80% ISR LCx mid 70-80% RCA patent EF 40%  Echo 5/11 Mod LVH, EF 60-65%, inf and apical severe HK, ant-septal mod HK, Gr 1 DD   ASSESSMENT:     1. Coronary artery disease involving native coronary artery of native heart with angina pectoris (HCC)   2. Essential hypertension   3. Hyperlipidemia   4. Tobacco abuse     PLAN:     In order of problems listed above:  1. CAD - The patient notes stable exertional anginal symptoms. He has had these since his bypass surgery. However, his ECG does show more prominent lateral T-wave inversions. He continues to smoke and is a diabetic. It has been 6 years since his bypass surgery.  -  Increase carvedilol to 9.375 mg twice a day  -  It is okay for him to change aspirin to 81 mg daily.  -  Continue statin, ARB.  -  Arrange ETT-Myoview.  -  Follow-up with Dr. [name] in 2 months.  2. HTN - Borderline control. Adjust carvedilol as noted.  3. HL - Continue statin. This is managed by primary care.  4. Tobacco abuse - I have recommended cessation today.    Medication Adjustments/Labs and Tests Ordered: Current medicines are reviewed at length with the patient today.  Concerns regarding medicines are outlined above.  Medication changes, Labs and Tests ordered today are outlined in the Patient Instructions noted below. Patient Instructions  Medication Instructions:  1.  INCREASE COREG TO 9.375 MG TWICE DAILY (THIS WILL BE 1 AND 1/2 TABS TWICE DAILY); NEW RX  2. FINISH YOUR BOTTLE OF ASPIRIN 325 MG ; WHEN DONE THEN START ASPIRIN 81 MG DAILY  3. YOU HAVE BEEN GIVEN AN RX FOR NITROGLYCERIN; YOU HAVE BEEN ADVISED AS TO HOW AND WHEN TO USE NTG   Labwork: PLEASE HAVE YOUR PCP FAX LAB RESULTS THAT YOU WILL BE HAVING DONE  LATER THIS MONTH; TO [facility] [phone]  Testing/Procedures: Your physician has requested that you have en exercise stress myoview. For further information please visit [email or web address] Please follow instruction sheet, as given.  Follow-Up: DR. [name] IN 6-8 WEEKS  Any Other Special Instructions Will Be Listed Below (If Applicable).  If you need a refill on your cardiac medications before your next appointment, please call your pharmacy.     Signed, Tereso Newcomer, PA-C  02/01/2016 4:49 PM    [facility] [address] [place], [place], [place]  [address] Phone: [phone]; Fax: [phone]

## 2016-02-01 ENCOUNTER — Encounter: Payer: Self-pay | Admitting: Physician Assistant

## 2016-02-01 ENCOUNTER — Ambulatory Visit (INDEPENDENT_AMBULATORY_CARE_PROVIDER_SITE_OTHER): Payer: Federal, State, Local not specified - PPO | Admitting: Physician Assistant

## 2016-02-01 VITALS — BP 140/84 | HR 70 | Ht 68.5 in | Wt 210.0 lb

## 2016-02-01 DIAGNOSIS — I25119 Atherosclerotic heart disease of native coronary artery with unspecified angina pectoris: Secondary | ICD-10-CM | POA: Diagnosis not present

## 2016-02-01 DIAGNOSIS — Z720 Tobacco use: Secondary | ICD-10-CM

## 2016-02-01 DIAGNOSIS — I10 Essential (primary) hypertension: Secondary | ICD-10-CM | POA: Diagnosis not present

## 2016-02-01 DIAGNOSIS — E785 Hyperlipidemia, unspecified: Secondary | ICD-10-CM | POA: Diagnosis not present

## 2016-02-01 DIAGNOSIS — R079 Chest pain, unspecified: Secondary | ICD-10-CM

## 2016-02-01 DIAGNOSIS — I2510 Atherosclerotic heart disease of native coronary artery without angina pectoris: Secondary | ICD-10-CM

## 2016-02-01 MED ORDER — NITROGLYCERIN 0.4 MG SL SUBL: 0.4000 mg | SUBLINGUAL_TABLET | SUBLINGUAL | Status: DC | PRN

## 2016-02-01 MED ORDER — CARVEDILOL 6.25 MG PO TABS: 9.3750 mg | ORAL_TABLET | Freq: Two times a day (BID) | ORAL | Status: DC

## 2016-02-01 MED ORDER — ASPIRIN EC 81 MG PO TBEC: 81.0000 mg | DELAYED_RELEASE_TABLET | Freq: Every day | ORAL | Status: DC

## 2016-02-01 NOTE — Patient Instructions (Addendum)
Medication Instructions:  1. INCREASE COREG TO 9.375 MG TWICE DAILY (THIS WILL BE 1 AND 1/2 TABS TWICE DAILY); NEW RX  2. FINISH YOUR BOTTLE OF ASPIRIN 325 MG ; WHEN DONE THEN START ASPIRIN 81 MG DAILY  3. YOU HAVE BEEN GIVEN AN RX FOR NITROGLYCERIN; YOU HAVE BEEN ADVISED AS TO HOW AND WHEN TO USE NTG   Labwork: PLEASE HAVE YOUR PCP FAX LAB RESULTS THAT YOU WILL BE HAVING DONE LATER THIS MONTH; TO [facility] [phone]  Testing/Procedures: Your physician has requested that you have en exercise stress myoview. For further information please visit [email or web address] Please follow instruction sheet, as given.  Follow-Up: DR. [name] IN 6-8 WEEKS  Any Other Special Instructions Will Be Listed Below (If Applicable).  If you need a refill on your cardiac medications before your next appointment, please call your pharmacy.

## 2016-02-22 ENCOUNTER — Telehealth (HOSPITAL_COMMUNITY): Payer: Self-pay | Admitting: *Deleted

## 2016-02-22 NOTE — Telephone Encounter (Signed)
Patient given detailed instructions per Myocardial Perfusion Study Information Sheet for the test on 02/27/16. Patient notified to arrive 15 minutes early and that it is imperative to arrive on time for appointment to keep from having the test rescheduled.  If you need to cancel or reschedule your appointment, please call the office within 24 hours of your appointment. Failure to do so may result in a cancellation of your appointment, and a $50 no show fee. Patient verbalized understanding.Yazleemar Strassner J Kursten Kruk, RN  

## 2016-02-27 ENCOUNTER — Ambulatory Visit (HOSPITAL_COMMUNITY): Payer: No Typology Code available for payment source | Attending: Cardiovascular Disease

## 2016-02-27 DIAGNOSIS — R0609 Other forms of dyspnea: Secondary | ICD-10-CM | POA: Diagnosis not present

## 2016-02-27 DIAGNOSIS — R9439 Abnormal result of other cardiovascular function study: Secondary | ICD-10-CM | POA: Diagnosis not present

## 2016-02-27 DIAGNOSIS — E119 Type 2 diabetes mellitus without complications: Secondary | ICD-10-CM | POA: Diagnosis not present

## 2016-02-27 DIAGNOSIS — Z8249 Family history of ischemic heart disease and other diseases of the circulatory system: Secondary | ICD-10-CM | POA: Diagnosis not present

## 2016-02-27 DIAGNOSIS — I10 Essential (primary) hypertension: Secondary | ICD-10-CM | POA: Diagnosis not present

## 2016-02-27 DIAGNOSIS — I25119 Atherosclerotic heart disease of native coronary artery with unspecified angina pectoris: Secondary | ICD-10-CM

## 2016-02-27 LAB — MYOCARDIAL PERFUSION IMAGING
CHL CUP MPHR: 166 {beats}/min
CHL CUP NUCLEAR SRS: 9
CHL CUP NUCLEAR SSS: 11
CSEPED: 6 min
CSEPEDS: 0 s
CSEPPHR: 162 {beats}/min
Estimated workload: 7 METS
LVDIAVOL: 133 mL
LVSYSVOL: 73 mL
NUC STRESS TID: 1
Percent HR: 98 %
RATE: 0.3
Rest HR: 58 {beats}/min
SDS: 2

## 2016-02-27 MED ORDER — TECHNETIUM TC 99M SESTAMIBI GENERIC - CARDIOLITE
10.3000 | Freq: Once | INTRAVENOUS | Status: AC | PRN
  Administered 2016-02-27: 10 via INTRAVENOUS

## 2016-02-27 MED ORDER — TECHNETIUM TC 99M SESTAMIBI GENERIC - CARDIOLITE
32.7000 | Freq: Once | INTRAVENOUS | Status: AC | PRN
  Administered 2016-02-27: 32.7 via INTRAVENOUS

## 2016-02-29 ENCOUNTER — Telehealth: Payer: Self-pay | Admitting: *Deleted

## 2016-02-29 NOTE — Telephone Encounter (Signed)
Lmptcb to go over Kelly results and recommendations about starting Imdur. Need to ask pt if he is taking Ciais, Levitra or Viagra before starting Imdur.

## 2016-03-01 NOTE — Telephone Encounter (Signed)
LM for patient to call back to go over med instructions. Advised he call back and ask for Okey Regal on 3/9 Message routed to Perla

## 2016-03-01 NOTE — Telephone Encounter (Signed)
Returned call to patient and notified him of stress test results. He does not take Cialis, Viagra, Levitra but is hesitant to try isosorbide as he has taken these in the past and they caused him to have headaches (on allergy list). Informed him I will defer to S. Alben Spittle, PA to see if trial of lower dose would be appropriate, Ranexa, low-dose amlodipine for anginal symptoms?  and that we would contact him accordingly. He voiced understanding.

## 2016-03-01 NOTE — Telephone Encounter (Signed)
DC Imdur. Start Norvasc 2.5 mg QD. Tereso Newcomer, PA-C   03/01/2016 4:52 PM

## 2016-03-01 NOTE — Telephone Encounter (Signed)
Rodney Gregory is calling back to get the results to his Echo.

## 2016-03-02 MED ORDER — AMLODIPINE BESYLATE 2.5 MG PO TABS: 2.5000 mg | ORAL_TABLET | Freq: Every day | ORAL | Status: DC

## 2016-03-02 NOTE — Telephone Encounter (Signed)
I s/w pt today and advised to not start Imdur, that we will start Norvasc 2.5 mg daily, Rx sent in to [facility] on [address]. Pt agreeable to plan of care.

## 2016-03-06 ENCOUNTER — Ambulatory Visit: Payer: No Typology Code available for payment source | Admitting: Interventional Cardiology

## 2016-03-29 ENCOUNTER — Encounter: Payer: Self-pay | Admitting: Interventional Cardiology

## 2016-03-29 ENCOUNTER — Other Ambulatory Visit: Payer: Self-pay | Admitting: *Deleted

## 2016-03-29 ENCOUNTER — Ambulatory Visit (INDEPENDENT_AMBULATORY_CARE_PROVIDER_SITE_OTHER): Payer: 59 | Admitting: Interventional Cardiology

## 2016-03-29 VITALS — BP 122/88 | HR 68 | Ht 68.5 in | Wt 210.0 lb

## 2016-03-29 DIAGNOSIS — E785 Hyperlipidemia, unspecified: Secondary | ICD-10-CM | POA: Diagnosis not present

## 2016-03-29 DIAGNOSIS — I10 Essential (primary) hypertension: Secondary | ICD-10-CM

## 2016-03-29 DIAGNOSIS — Z720 Tobacco use: Secondary | ICD-10-CM | POA: Diagnosis not present

## 2016-03-29 DIAGNOSIS — I25810 Atherosclerosis of coronary artery bypass graft(s) without angina pectoris: Secondary | ICD-10-CM | POA: Diagnosis not present

## 2016-03-29 NOTE — Progress Notes (Signed)
Cardiology Office Note   Date:  03/29/2016   ID:  Rodney Gregory, DOB 03-08-1961, MRN [id number]  PCP:  Redmond Baseman, MD  Cardiologist:  [name], MD   Chief Complaint  Patient presents with  . Coronary Artery Disease      History of Present Illness: Rodney Gregory is a 55 y.o. male who presents for Follow up CAD with bypass including LIMA to LAD, SVG to circumflex, and SVG to diagonal 2011, hyperlipidemia, hypertension, and diabetes mellitus.  Recently seen in undergoing exercise treadmill testing by Tereso Newcomer [facility]. The study demonstrated a fixed defect in the anteroapical region. Minimal peri-infarct ischemia was noted. ST segment changes were noted after 6 minutes of exercise. No exercise induced angina was noted. EF 45-50%. Overall, Giann feels his exertional tolerance is been stable. He does feel dizzy and lightheaded if he has been sitting for quite some time and stand suddenly. He has not needed to use nitroglycerin. He denies chest discomfort and dyspnea at rest.    Past Medical History  Diagnosis Date  . Hypertension   . Anemia     hx of blood loss anemia  . Myocardial infarction (HCC)   . Hyperlipidemia   . Coronary artery disease   . Diabetes mellitus 12/18/2011    Past Surgical History  Procedure Laterality Date  . Back surgery    . Coronary artery bypass graft  02/2010  . Rotator cuff repair  2009    right     Current Outpatient Prescriptions  Medication Sig Dispense Refill  . amLODipine (NORVASC) 2.5 MG tablet Take 1 tablet (2.5 mg total) by mouth daily. 30 tablet 11  . aspirin EC 81 MG tablet Take 1 tablet (81 mg total) by mouth daily. DO NOT START UNTIL FINISHED WITH 325 MG ASPIRIN    . atorvastatin (LIPITOR) 10 MG tablet Take 10 mg by mouth daily.    . carvedilol (COREG) 6.25 MG tablet Take 1.5 tablets (9.375 mg total) by mouth 2 (two) times daily with a meal. 180 tablet 3  . glimepiride (AMARYL) 2 MG tablet Take 2 mg by mouth daily.   5  . losartan-hydrochlorothiazide (HYZAAR) 50-12.5 MG tablet 1 TABLET ONCE A DAY ORALLY 30  2  . metFORMIN (GLUCOPHAGE) 1000 MG tablet 1 TABLET TWICE A DAY BY MOUTH 30  1  . nitroGLYCERIN (NITROSTAT) 0.4 MG SL tablet Place 1 tablet (0.4 mg total) under the tongue every 5 (five) minutes as needed for chest pain. 25 tablet 3   No current facility-administered medications for this visit.    Allergies:   Ace inhibitors; Imdur; and Isosorbide nitrate    Social History:  The patient  reports that he has been smoking Cigarettes.  He does not have any smokeless tobacco history on file. He reports that he does not drink alcohol or use illicit drugs.   Family History:  The patient's family history includes Diabetes in his mother; Heart attack in his mother; Hypertension in his father and mother.    ROS:  Please see the history of present illness.   Otherwise, review of systems are positive for shortness of breath, vision disturbance, constipation, difficulty with balance, back discomfort, and orthostatic dizziness..   All other systems are reviewed and negative.    PHYSICAL EXAM: VS:  BP 122/88 mmHg  Pulse 68  Ht 5' 8.5" (1.74 m)  Wt 210 lb (95.255 kg)  BMI 31.46 kg/m2 , BMI Body mass index is 31.46 kg/(m^2). GEN:  Well nourished, well developed, in no acute distress HEENT: normal Neck: no JVD, carotid bruits, or masses Cardiac: RRR.  There is no murmur, rub, or gallop. There is no edema. Respiratory:  clear to auscultation bilaterally, normal work of breathing. GI: soft, nontender, nondistended, + BS MS: no deformity or atrophy Skin: warm and dry, no rash Neuro:  Strength and sensation are intact Psych: euthymic mood, full affect   EKG:  EKG is not ordered today. The ekg from 02/01/16 reveals prominent voltage and anterolateral T-wave abnormality consistent with left ventricular hypertrophy. Most recent echo from 2011 demonstrated moderate left ventricular hypertrophy.   Recent Labs: No  results found for requested labs within last 365 days.    Lipid Panel    Component Value Date/Time   CHOL 107 09/22/2014 0948   TRIG 91.0 09/22/2014 0948   HDL 26.00* 09/22/2014 0948   CHOLHDL 4 09/22/2014 0948   VLDL 18.2 09/22/2014 0948   LDLCALC 63 09/22/2014 0948      Wt Readings from Last 3 Encounters:  03/29/16 210 lb (95.255 kg)  02/01/16 210 lb (95.255 kg)  09/27/14 214 lb 1.9 oz (97.124 kg)      Other studies Reviewed: Additional studies/ records that were reviewed today include: Reviewed nuclear stress test. Reviewed prior cath results. Reviewed bypass surgery notes.. The findings include he has LIMA to LAD, saphenous vein graft to circumflex, and saphenous vein graft to diagonal. Echocardiogram in 2011 revealed moderate LVH..    ASSESSMENT AND PLAN:  1. Essential hypertension Controlled with some orthostatic dizziness not documented be orthostatic by blood pressure monitoring area Blood pressure lying 140/80 mmHg; sitting 132/85 mmHg; standing 140/85 mmHg  2. Coronary artery disease involving coronary bypass graft of native heart without angina pectoris CABG  3. Hyperlipidemia Followed by primary  4. Tobacco abuse Denies alcohol use and tobacco use  5. Left ventricular hypertrophy This accounts for the abnormal resting EKG and ST segment changes with exercise.    Current medicines are reviewed at length with the patient today.  The patient has the following concerns regarding medicines: None.  The following changes/actions have been instituted:    Encouraged tight blood pressure control to avoid kidney and heart injury in setting of diabetes  Encouraged aerobic activity  Labs/ tests ordered today include:  No orders of the defined types were placed in this encounter.     Disposition:   FU with HS in 1 year  Signed, [name], MD  03/29/2016 10:54 AM    [facility] [address] [place], [place], [place]  [address] Phone:  [phone]; Fax: [phone]

## 2016-03-29 NOTE — Patient Instructions (Signed)

## 2016-05-15 ENCOUNTER — Other Ambulatory Visit: Payer: Self-pay

## 2016-05-15 MED ORDER — AMLODIPINE BESYLATE 2.5 MG PO TABS: 2.5000 mg | ORAL_TABLET | Freq: Every day | ORAL | Status: DC

## 2016-09-15 IMAGING — NM NM MISC PROCEDURE
3 series · 18 of 18 positions shown · non-contrast
Comparison: none

[Series 1: wbr_s-proj_st stress_(id)_sa · 6.5mm · 6.51mm/px · 6 of 64 frames shown (1 of 2)]
[frame 6/64]
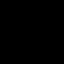
[frame 16/64]
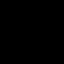
[frame 27/64]
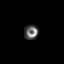
[frame 38/64]
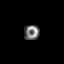
[frame 48/64]
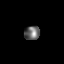
[frame 59/64]
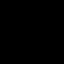

[Series 1: wbr_r-proj_st rest_(id)_sa · 6.5mm · 6.51mm/px · 6 of 64 frames shown]
[frame 6/64]
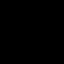
[frame 16/64]
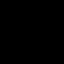
[frame 27/64]
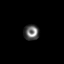
[frame 38/64]
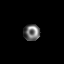
[frame 48/64]
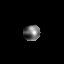
[frame 59/64]
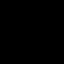

[Series 1: wbr_s-proj_st stress_(id)_sa · 6.5mm · 6.51mm/px · 6 of 512 frames shown (2 of 2)]
[frame 43/512]
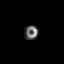
[frame 128/512]
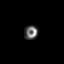
[frame 214/512]
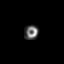
[frame 299/512]
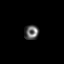
[frame 384/512]
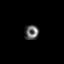
[frame 470/512]
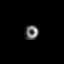

[18 of 18 positions shown; findings below may reference images not displayed]

Canned report from images found in remote index.

Refer to host system for actual result text.

## 2017-02-17 ENCOUNTER — Other Ambulatory Visit: Payer: Self-pay | Admitting: Physician Assistant

## 2017-02-17 DIAGNOSIS — I10 Essential (primary) hypertension: Secondary | ICD-10-CM

## 2017-04-09 ENCOUNTER — Ambulatory Visit: Payer: 59 | Admitting: Interventional Cardiology

## 2017-04-09 NOTE — Progress Notes (Deleted)
Cardiology Office Note    Date:  04/09/2017   ID:  Rodney Gregory, DOB 1961/01/03, MRN [id number]  PCP:  Redmond Baseman, MD  Cardiologist: [name], MD   No chief complaint on file.   History of Present Illness:  Rodney Gregory is a 56 y.o. male ***    Past Medical History:  Diagnosis Date  . Anemia    hx of blood loss anemia  . Coronary artery disease   . Diabetes mellitus 12/18/2011  . Hyperlipidemia   . Hypertension   . Myocardial infarction     Past Surgical History:  Procedure Laterality Date  . BACK SURGERY    . CORONARY ARTERY BYPASS GRAFT  02/2010  . ROTATOR CUFF REPAIR  2009   right    Current Medications: Outpatient Medications Prior to Visit  Medication Sig Dispense Refill  . amLODipine (NORVASC) 2.5 MG tablet Take 1 tablet (2.5 mg total) by mouth daily. 90 tablet 3  . aspirin EC 81 MG tablet Take 1 tablet (81 mg total) by mouth daily. DO NOT START UNTIL FINISHED WITH 325 MG ASPIRIN    . atorvastatin (LIPITOR) 10 MG tablet Take 10 mg by mouth daily.    . carvedilol (COREG) 6.25 MG tablet TAKE 1.5 TABLETS BY MOUTH 2 TIMES DAILY WITH A MEAL. 270 tablet 0  . glimepiride (AMARYL) 2 MG tablet Take 2 mg by mouth daily.  5  . losartan-hydrochlorothiazide (HYZAAR) 50-12.5 MG tablet 1 TABLET ONCE A DAY ORALLY 30  2  . metFORMIN (GLUCOPHAGE) 1000 MG tablet 1 TABLET TWICE A DAY BY MOUTH 30  1  . nitroGLYCERIN (NITROSTAT) 0.4 MG SL tablet Place 1 tablet (0.4 mg total) under the tongue every 5 (five) minutes as needed for chest pain. 25 tablet 3   No facility-administered medications prior to visit.      Allergies:   Ace inhibitors; Imdur [isosorbide mononitrate]; and Isosorbide nitrate   Social History   Social History  . Marital status: Married    Spouse name: N/A  . Number of children: N/A  . Years of education: N/A   Social History Main Topics  . Smoking status: Current Some Day Smoker    Types: Cigarettes  . Smokeless tobacco: Not on  file  . Alcohol use No  . Drug use: No  . Sexual activity: Yes   Other Topics Concern  . Not on file   Social History Narrative  . No narrative on file     Family History:  The patient's ***family history includes Diabetes in his mother; Heart attack in his mother; Hypertension in his father and mother.   ROS:   Please see the history of present illness.    ***  All other systems reviewed and are negative.   PHYSICAL EXAM:   VS:  There were no vitals taken for this visit.   GEN: Well nourished, well developed, in no acute distress HEENT: normal Neck: no JVD, carotid bruits, or masses Cardiac: ***RRR; no murmurs, rubs, or gallops,no edema  Respiratory:  clear to auscultation bilaterally, normal work of breathing GI: soft, nontender, nondistended, + BS MS: no deformity or atrophy Skin: warm and dry, no rash Neuro:  Alert and Oriented x 3, Strength and sensation are intact Psych: euthymic mood, full affect  Wt Readings from Last 3 Encounters:  03/29/16 210 lb (95.3 kg)  02/01/16 210 lb (95.3 kg)  09/27/14 214 lb 1.9 oz (97.1 kg)      Studies/Labs  Reviewed:   EKG:  EKG  ***  Recent Labs: No results found for requested labs within last 8760 hours.   Lipid Panel    Component Value Date/Time   CHOL 107 09/22/2014 0948   TRIG 91.0 09/22/2014 0948   HDL 26.00 (L) 09/22/2014 0948   CHOLHDL 4 09/22/2014 0948   VLDL 18.2 09/22/2014 0948   LDLCALC 63 09/22/2014 0948    Additional studies/ records that were reviewed today include:  ***    ASSESSMENT:    1. Coronary artery disease involving native coronary artery of native heart with angina pectoris (HCC)   2. Essential hypertension   3. Old MI (myocardial infarction)      PLAN:  In order of problems listed above:  1. ***    Medication Adjustments/Labs and Tests Ordered: Current medicines are reviewed at length with the patient today.  Concerns regarding medicines are outlined above.  Medication  changes, Labs and Tests ordered today are listed in the Patient Instructions below. There are no Patient Instructions on file for this visit.   Signed, [name], MD  04/09/2017 11:07 AM    [facility] [address] [place], [place], [place]  [address] Phone: [phone]; Fax: [phone]

## 2017-05-15 NOTE — Progress Notes (Signed)
Cardiology Office Note    Date:  05/16/2017   ID:  Rodney Gregory, DOB Mar 24, 1961, MRN [id number]  PCP:  [name], MD  Cardiologist: [name], MD   Chief Complaint  Patient presents with  . Coronary Artery Disease    History of Present Illness:  Rodney Gregory is a 56 y.o. male for follow up CAD with bypass including LIMA to LAD, SVG to circumflex, and SVG to diagonal 2011, hyperlipidemia, hypertension, and diabetes mellitus.  Gaining weight, not exercising, and feels lethargic most of the time. No complaints about snoring. He denies orthopnea, PND, and exertional dyspnea. He has had no prolonged palpitations or episodes of syncope. Overall he is doing well with the exception of various musculoskeletal complaints including low back pain (see below).  Past Medical History:  Diagnosis Date  . Anemia    hx of blood loss anemia  . Coronary artery disease   . Diabetes mellitus 12/18/2011  . Hyperlipidemia   . Hypertension   . Myocardial infarction [facility])     Past Surgical History:  Procedure Laterality Date  . BACK SURGERY    . CORONARY ARTERY BYPASS GRAFT  02/2010  . ROTATOR CUFF REPAIR  2009   right    Current Medications: Outpatient Medications Prior to Visit  Medication Sig Dispense Refill  . amLODipine (NORVASC) 2.5 MG tablet Take 1 tablet (2.5 mg total) by mouth daily. 90 tablet 3  . aspirin EC 81 MG tablet Take 1 tablet (81 mg total) by mouth daily. DO NOT START UNTIL FINISHED WITH 325 MG ASPIRIN    . atorvastatin (LIPITOR) 10 MG tablet Take 10 mg by mouth daily.    . carvedilol (COREG) 6.25 MG tablet TAKE 1.5 TABLETS BY MOUTH 2 TIMES DAILY WITH A MEAL. 270 tablet 0  . glimepiride (AMARYL) 2 MG tablet Take 2 mg by mouth daily.  5  . losartan-hydrochlorothiazide (HYZAAR) 50-12.5 MG tablet 1 TABLET ONCE A DAY ORALLY 30  2  . nitroGLYCERIN (NITROSTAT) 0.4 MG SL tablet Place 1 tablet (0.4 mg total) under the tongue every 5 (five) minutes as needed for chest  pain. 25 tablet 3  . metFORMIN (GLUCOPHAGE) 1000 MG tablet 1 TABLET TWICE A DAY BY MOUTH 30  1   No facility-administered medications prior to visit.      Allergies:   Ace inhibitors; Imdur [isosorbide mononitrate]; and Isosorbide nitrate   Social History   Social History  . Marital status: Married    Spouse name: N/A  . Number of children: N/A  . Years of education: N/A   Social History Main Topics  . Smoking status: Former Smoker    Types: Cigarettes  . Smokeless tobacco: Never Used  . Alcohol use No  . Drug use: No  . Sexual activity: Yes   Other Topics Concern  . None   Social History Narrative  . None     Family History:  The patient's family history includes Diabetes in his mother; Heart attack in his mother; Hypertension in his father and mother.   ROS:   Please see the history of present illness.    Low back discomfort. The back hurts all the time. It limits his ability to exercise. He has pain as well when he is at rest. Otherwise no complaints. Recent blood pressure medication adjustments by Dr. Modesto Charon.  All other systems reviewed and are negative.   PHYSICAL EXAM:   VS:  BP (!) 142/88   Pulse Marland Kitchen)  57   Ht 5' 8.5" (1.74 m)   Wt 209 lb 12.8 oz (95.2 kg)   BMI 31.44 kg/m    GEN: Well nourished, well developed, in no acute distress  HEENT: normal  Neck: no JVD, carotid bruits, or masses Cardiac: RRR; no murmurs, rubs, or gallops,no edema  Respiratory:  clear to auscultation bilaterally, normal work of breathing GI: soft, nontender, nondistended, + BS MS: no deformity or atrophy  Skin: warm and dry, no rash Neuro:  Alert and Oriented x 3, Strength and sensation are intact Psych: euthymic mood, full affect  Wt Readings from Last 3 Encounters:  05/16/17 209 lb 12.8 oz (95.2 kg)  03/29/16 210 lb (95.3 kg)  02/01/16 210 lb (95.3 kg)      Studies/Labs Reviewed:   EKG:  EKG Sinus rhythm, LVH with strain pattern, borderline short PR, and an occasional  PVC. No significant change when compared to prior.  Recent Labs: No results found for requested labs within last 8760 hours.   Lipid Panel    Component Value Date/Time   CHOL 107 09/22/2014 0948   TRIG 91.0 09/22/2014 0948   HDL 26.00 (L) 09/22/2014 0948   CHOLHDL 4 09/22/2014 0948   VLDL 18.2 09/22/2014 0948   LDLCALC 63 09/22/2014 0948    Additional studies/ records that were reviewed today include:  No new imaging or functional data.    ASSESSMENT:    1. Coronary artery disease involving native coronary artery of native heart with angina pectoris (HCC)   2. Essential hypertension   3. S/P CABG x 3   4. Hyperlipidemia, unspecified hyperlipidemia type      PLAN:  In order of problems listed above:  1. No symptoms of angina. Our goal is continued tight risk factor modification including LDL less than 70, target blood pressure 130/90 mmHg or less, A1c less than 7, weight loss, and aerobic exercise. 2. Target blood pressures mentioned above. Dr. Modesto Charon is making adjustments in current medical regimen. He is now taking both an ARB and an Ace. Hopefully therapy can be consolidated to an ace plus HCTZ. Target blood pressure 130/90 mmHg or less. Discussed low salt diet, exercise, and weight loss. 3. As noted above 4. LDL target less than 70  Encouraged to exercise, lose weight, and be mindful of risk factor modification. Clinical follow-up in one year.    Medication Adjustments/Labs and Tests Ordered: Current medicines are reviewed at length with the patient today.  Concerns regarding medicines are outlined above.  Medication changes, Labs and Tests ordered today are listed in the Patient Instructions below. Patient Instructions  Medication Instructions:  None  Labwork: None  Testing/Procedures: None  Follow-Up: Your physician wants you to follow-up in: 1 year with Dr.Nyari Olsson.  You will receive a reminder letter in the mail two months in advance. If you don't receive a  letter, please call our office to schedule the follow-up appointment.   Any Other Special Instructions Will Be Listed Below (If Applicable).     If you need a refill on your cardiac medications before your next appointment, please call your pharmacy.      Signed, [name], MD  05/16/2017 10:15 AM    [facility] [address] [place], [place], [place]  [address] Phone: [phone]; Fax: [phone]

## 2017-05-16 ENCOUNTER — Encounter: Payer: Self-pay | Admitting: Interventional Cardiology

## 2017-05-16 ENCOUNTER — Ambulatory Visit (INDEPENDENT_AMBULATORY_CARE_PROVIDER_SITE_OTHER): Payer: Managed Care, Other (non HMO) | Admitting: Interventional Cardiology

## 2017-05-16 ENCOUNTER — Encounter (INDEPENDENT_AMBULATORY_CARE_PROVIDER_SITE_OTHER): Payer: Self-pay

## 2017-05-16 VITALS — BP 142/88 | HR 57 | Ht 68.5 in | Wt 209.8 lb

## 2017-05-16 DIAGNOSIS — Z951 Presence of aortocoronary bypass graft: Secondary | ICD-10-CM

## 2017-05-16 DIAGNOSIS — I10 Essential (primary) hypertension: Secondary | ICD-10-CM

## 2017-05-16 DIAGNOSIS — E785 Hyperlipidemia, unspecified: Secondary | ICD-10-CM

## 2017-05-16 DIAGNOSIS — I25119 Atherosclerotic heart disease of native coronary artery with unspecified angina pectoris: Secondary | ICD-10-CM | POA: Diagnosis not present

## 2017-05-16 MED ORDER — NITROGLYCERIN 0.4 MG SL SUBL: 0.4000 mg | SUBLINGUAL_TABLET | SUBLINGUAL | 3 refills | Status: DC | PRN

## 2017-05-16 NOTE — Patient Instructions (Signed)

## 2017-06-02 ENCOUNTER — Inpatient Hospital Stay (HOSPITAL_COMMUNITY)
Admission: EM | Admit: 2017-06-02 | Discharge: 2017-06-04 | DRG: 281 | Disposition: A | Discharge status: Home / Self Care | Payer: Managed Care, Other (non HMO) | Attending: Internal Medicine | Admitting: Internal Medicine

## 2017-06-02 ENCOUNTER — Emergency Department (HOSPITAL_COMMUNITY): Payer: Managed Care, Other (non HMO)

## 2017-06-02 ENCOUNTER — Encounter (HOSPITAL_COMMUNITY): Payer: Self-pay | Admitting: Emergency Medicine

## 2017-06-02 DIAGNOSIS — T82857A Stenosis of cardiac prosthetic devices, implants and grafts, initial encounter: Secondary | ICD-10-CM | POA: Diagnosis present

## 2017-06-02 DIAGNOSIS — I214 Non-ST elevation (NSTEMI) myocardial infarction: Principal | ICD-10-CM | POA: Diagnosis present

## 2017-06-02 DIAGNOSIS — Z7982 Long term (current) use of aspirin: Secondary | ICD-10-CM

## 2017-06-02 DIAGNOSIS — I25110 Atherosclerotic heart disease of native coronary artery with unstable angina pectoris: Secondary | ICD-10-CM | POA: Diagnosis present

## 2017-06-02 DIAGNOSIS — R079 Chest pain, unspecified: Secondary | ICD-10-CM

## 2017-06-02 DIAGNOSIS — I25709 Atherosclerosis of coronary artery bypass graft(s), unspecified, with unspecified angina pectoris: Secondary | ICD-10-CM | POA: Diagnosis not present

## 2017-06-02 DIAGNOSIS — Z951 Presence of aortocoronary bypass graft: Secondary | ICD-10-CM

## 2017-06-02 DIAGNOSIS — Z7984 Long term (current) use of oral hypoglycemic drugs: Secondary | ICD-10-CM

## 2017-06-02 DIAGNOSIS — E785 Hyperlipidemia, unspecified: Secondary | ICD-10-CM | POA: Diagnosis present

## 2017-06-02 DIAGNOSIS — Z87891 Personal history of nicotine dependence: Secondary | ICD-10-CM | POA: Diagnosis not present

## 2017-06-02 DIAGNOSIS — Z8249 Family history of ischemic heart disease and other diseases of the circulatory system: Secondary | ICD-10-CM | POA: Diagnosis not present

## 2017-06-02 DIAGNOSIS — I255 Ischemic cardiomyopathy: Secondary | ICD-10-CM | POA: Diagnosis present

## 2017-06-02 DIAGNOSIS — I25700 Atherosclerosis of coronary artery bypass graft(s), unspecified, with unstable angina pectoris: Secondary | ICD-10-CM | POA: Diagnosis present

## 2017-06-02 DIAGNOSIS — Z888 Allergy status to other drugs, medicaments and biological substances status: Secondary | ICD-10-CM

## 2017-06-02 DIAGNOSIS — E119 Type 2 diabetes mellitus without complications: Secondary | ICD-10-CM | POA: Diagnosis present

## 2017-06-02 DIAGNOSIS — Z79899 Other long term (current) drug therapy: Secondary | ICD-10-CM | POA: Diagnosis not present

## 2017-06-02 DIAGNOSIS — I10 Essential (primary) hypertension: Secondary | ICD-10-CM | POA: Diagnosis present

## 2017-06-02 LAB — CBC
HEMATOCRIT: 41.3 % (ref 39.0–52.0)
HEMOGLOBIN: 14.1 g/dL (ref 13.0–17.0)
MCH: 32.8 pg (ref 26.0–34.0)
MCHC: 34.1 g/dL (ref 30.0–36.0)
MCV: 96 fL (ref 78.0–100.0)
Platelets: 144 10*3/uL — ABNORMAL LOW (ref 150–400)
RBC: 4.3 MIL/uL (ref 4.22–5.81)
RDW: 13.3 % (ref 11.5–15.5)
WBC: 7.6 10*3/uL (ref 4.0–10.5)

## 2017-06-02 LAB — HEPARIN LEVEL (UNFRACTIONATED): Heparin Unfractionated: 0.35 IU/mL (ref 0.30–0.70)

## 2017-06-02 LAB — BASIC METABOLIC PANEL
ANION GAP: 7 (ref 5–15)
BUN: 9 mg/dL (ref 6–20)
CALCIUM: 9.2 mg/dL (ref 8.9–10.3)
CHLORIDE: 108 mmol/L (ref 101–111)
CO2: 23 mmol/L (ref 22–32)
Creatinine, Ser: 1.11 mg/dL (ref 0.61–1.24)
GFR calc Af Amer: >60 mL/min (ref >60)
GFR calc non Af Amer: >60 mL/min (ref >60)
GLUCOSE: 169 mg/dL — AB (ref 65–99)
POTASSIUM: 3.7 mmol/L (ref 3.5–5.1)
Sodium: 138 mmol/L (ref 135–145)

## 2017-06-02 LAB — I-STAT TROPONIN, ED: Troponin i, poc: 3.21 ng/mL (ref 0.00–0.08)

## 2017-06-02 LAB — APTT: APTT: 101 s — AB (ref 24–36)

## 2017-06-02 LAB — TROPONIN I: TROPONIN I: 9.31 ng/mL — AB (ref <0.03)

## 2017-06-02 LAB — PROTIME-INR
INR: 0.97
PROTHROMBIN TIME: 12.9 s (ref 11.4–15.2)

## 2017-06-02 MED ORDER — SODIUM CHLORIDE 0.9% FLUSH
3.0000 mL | Freq: Two times a day (BID) | INTRAVENOUS | Status: DC
  Administered 2017-06-02: 3 mL via INTRAVENOUS

## 2017-06-02 MED ORDER — AMLODIPINE BESYLATE 2.5 MG PO TABS
2.5000 mg | ORAL_TABLET | Freq: Every day | ORAL | Status: DC
  Administered 2017-06-03: 2.5 mg via ORAL
  Filled 2017-06-02: qty 1

## 2017-06-02 MED ORDER — CARVEDILOL 6.25 MG PO TABS
6.2500 mg | ORAL_TABLET | Freq: Two times a day (BID) | ORAL | Status: DC
  Administered 2017-06-02 – 2017-06-04 (×5): 6.25 mg via ORAL
  Filled 2017-06-02 (×5): qty 1

## 2017-06-02 MED ORDER — ASPIRIN 81 MG PO CHEW: 324.0000 mg | CHEWABLE_TABLET | ORAL | Status: DC

## 2017-06-02 MED ORDER — ASPIRIN EC 81 MG PO TBEC
81.0000 mg | DELAYED_RELEASE_TABLET | Freq: Every day | ORAL | Status: DC
  Administered 2017-06-03 – 2017-06-04 (×2): 81 mg via ORAL
  Filled 2017-06-02 (×2): qty 1

## 2017-06-02 MED ORDER — SODIUM CHLORIDE 0.9 % IV SOLN
INTRAVENOUS | Status: DC
  Administered 2017-06-03: 70 mL/h via INTRAVENOUS

## 2017-06-02 MED ORDER — ASPIRIN 300 MG RE SUPP: 300.0000 mg | RECTAL | Status: DC

## 2017-06-02 MED ORDER — NITROGLYCERIN 0.4 MG SL SUBL: 0.4000 mg | SUBLINGUAL_TABLET | SUBLINGUAL | Status: DC | PRN

## 2017-06-02 MED ORDER — ONDANSETRON HCL 4 MG/2ML IJ SOLN: 4.0000 mg | Freq: Four times a day (QID) | INTRAMUSCULAR | Status: DC | PRN

## 2017-06-02 MED ORDER — HEPARIN (PORCINE) IN NACL 100-0.45 UNIT/ML-% IJ SOLN
1100.0000 [IU]/h | INTRAMUSCULAR | Status: DC
  Administered 2017-06-02 – 2017-06-03 (×2): 1100 [IU]/h via INTRAVENOUS
  Filled 2017-06-02 (×2): qty 250

## 2017-06-02 MED ORDER — SODIUM CHLORIDE 0.9% FLUSH: 3.0000 mL | INTRAVENOUS | Status: DC | PRN

## 2017-06-02 MED ORDER — ACETAMINOPHEN 325 MG PO TABS
650.0000 mg | ORAL_TABLET | ORAL | Status: DC | PRN
  Administered 2017-06-03: 650 mg via ORAL
  Filled 2017-06-02: qty 2

## 2017-06-02 MED ORDER — ASPIRIN EC 81 MG PO TBEC: 81.0000 mg | DELAYED_RELEASE_TABLET | Freq: Every day | ORAL | Status: DC

## 2017-06-02 MED ORDER — HEPARIN BOLUS VIA INFUSION
4000.0000 [IU] | Freq: Once | INTRAVENOUS | Status: AC
  Administered 2017-06-02: 4000 [IU] via INTRAVENOUS
  Filled 2017-06-02: qty 4000

## 2017-06-02 MED ORDER — ASPIRIN 81 MG PO CHEW
81.0000 mg | CHEWABLE_TABLET | ORAL | Status: AC
  Administered 2017-06-03: 81 mg via ORAL
  Filled 2017-06-02: qty 1

## 2017-06-02 MED ORDER — ASPIRIN 81 MG PO CHEW
324.0000 mg | CHEWABLE_TABLET | Freq: Once | ORAL | Status: AC
  Administered 2017-06-02: 324 mg via ORAL
  Filled 2017-06-02: qty 4

## 2017-06-02 MED ORDER — ATORVASTATIN CALCIUM 10 MG PO TABS
10.0000 mg | ORAL_TABLET | Freq: Every day | ORAL | Status: DC
  Administered 2017-06-03: 10 mg via ORAL
  Filled 2017-06-02: qty 1

## 2017-06-02 MED ORDER — SODIUM CHLORIDE 0.9 % IV SOLN: 250.0000 mL | INTRAVENOUS | Status: DC | PRN

## 2017-06-02 MED ORDER — GLIMEPIRIDE 1 MG PO TABS
2.0000 mg | ORAL_TABLET | Freq: Every day | ORAL | Status: DC
  Administered 2017-06-03 – 2017-06-04 (×2): 2 mg via ORAL
  Filled 2017-06-02 (×2): qty 2

## 2017-06-02 NOTE — ED Notes (Signed)
Family at bedside. 

## 2017-06-02 NOTE — H&P (Signed)
Primary Physician: Primary Cardiologist:  smith  Pt presents to ED with CP   HPI:  Pt is a 56 yo with history of CAD (s/p CABG  LIMA to LAD, SVG to LCx; SVG to Diag in 2011), HL, HTN and DM  He is followed in clinic by [facility]  Seen on 05/16/17  At that time he denied CP  Not too active, overall stable    Friday he felt weak  Like he had too much sun  Washed out Saturday again felt weak, sl nauseated  Like too much sun  Went to fish fry  Saturday night got home  Started having chest discomfort  Not severe  Like indigestion  Continued   Took several NTG without relief   Still felt weak Today wife brought him to ED   Given 2 ASA he says and discomfort eased off  Pt note no recent change in acitvity  No CP with activity         Past Medical History:  Diagnosis Date  . Anemia    hx of blood loss anemia  . Coronary artery disease   . Diabetes mellitus 12/18/2011  . Hyperlipidemia   . Hypertension   . Myocardial infarction [facility])      (Not in a hospital admission)     Infusions: . heparin 1,100 Units/hr (06/02/17 1149)    Allergies  Allergen Reactions  . Ace Inhibitors Other (See Comments)    headaches  . Imdur [Isosorbide Mononitrate] Other (See Comments)    Chest pain  . Isosorbide Nitrate Other (See Comments)    headaches    Social History   Social History  . Marital status: Married    Spouse name: N/A  . Number of children: N/A  . Years of education: N/A   Occupational History  . Not on file.   Social History Main Topics  . Smoking status: Former Smoker    Types: Cigarettes  . Smokeless tobacco: Never Used  . Alcohol use No  . Drug use: No  . Sexual activity: Yes   Other Topics Concern  . Not on file   Social History Narrative  . No narrative on file    Family History  Problem Relation Age of Onset  . Heart attack Mother   . Hypertension Mother   . Diabetes Mother   . Hypertension Father     REVIEW OF SYSTEMS:  All systems reviewed   Negative to the above problem except as noted above.    PHYSICAL EXAM: Vitals:   06/02/17 1015 06/02/17 1043  BP: 140/89 (!) 139/100  Pulse: 64 73  Resp: 18 17  Temp: 98.3 F (36.8 C)     No intake or output data in the 24 hours ending 06/02/17 1225  General:  Well appearing. No respiratory difficulty HEENT: normal Neck: supple. no JVD. Carotids 2+ bilat; no bruits. No lymphadenopathy or thryomegaly appreciated. Cor: PMI nondisplaced. Regular rate & rhythm. No rubs, gallops or murmurs. Lungs: clear Abdomen: soft, nontender, nondistended. No hepatosplenomegaly. No bruits or masses. Good bowel sounds. Extremities: no cyanosis, clubbing, rash, edema Neuro: alert & oriented x 3, cranial nerves grossly intact. moves all 4 extremities w/o difficulty. Affect pleasant.  ECG:  SR 67 bpm  1 mm ST depression in inferolateral leads  T wave inversion I, II, iii, V4 to V6    Present on previous EKGs    Results for orders placed or performed during the hospital encounter of 06/02/17 (from the  past 24 hour(s))  Basic metabolic panel     Status: Abnormal   Collection Time: 06/02/17 10:45 AM  Result Value Ref Range   Sodium 138 135 - 145 mmol/L   Potassium 3.7 3.5 - 5.1 mmol/L   Chloride 108 101 - 111 mmol/L   CO2 23 22 - 32 mmol/L   Glucose, Bld 169 (H) 65 - 99 mg/dL   BUN 9 6 - 20 mg/dL   Creatinine, Ser 1.61 0.61 - 1.24 mg/dL   Calcium 9.2 8.9 - 09.6 mg/dL   GFR calc non Af Amer >60 >60 mL/min   GFR calc Af Amer >60 >60 mL/min   Anion gap 7 5 - 15  CBC     Status: Abnormal   Collection Time: 06/02/17 10:45 AM  Result Value Ref Range   WBC 7.6 4.0 - 10.5 K/uL   RBC 4.30 4.22 - 5.81 MIL/uL   Hemoglobin 14.1 13.0 - 17.0 g/dL   HCT 04.5 40.9 - 81.1 %   MCV 96.0 78.0 - 100.0 fL   MCH 32.8 26.0 - 34.0 pg   MCHC 34.1 30.0 - 36.0 g/dL   RDW 91.4 78.2 - 95.6 %   Platelets 144 (L) 150 - 400 K/uL  I-stat troponin, ED     Status: Abnormal   Collection Time: 06/02/17 10:54 AM  Result Value  Ref Range   Troponin i, poc 3.21 (HH) 0.00 - 0.08 ng/mL   Comment NOTIFIED PHYSICIAN    Comment 3           Dg Chest 2 View  Result Date: 06/02/2017 CLINICAL DATA:  Right chest pain EXAM: CHEST  2 VIEW COMPARISON:  12/18/2011 FINDINGS: Lungs are clear.  No pleural effusion or pneumothorax. Mild cardiomegaly.  Postsurgical changes related to prior CABG. Degenerative changes of the visualized thoracolumbar spine. Median sternotomy. IMPRESSION: Normal chest radiographs. Electronically Signed   By: Charline Bills M.D.   On: 06/02/2017 10:37     ASSESSMENT: 56 yo with known CAD  Presents with 2 days of not feeling good (weak, drained)  Last night develped chest discomfort    On exam:  No evid of CHF  EKG with baseline changes that are old Labs signif for trop elevation at 3.21  I would reocmm  Admit Continue IV heparin  ASA   Plan for heart catheterization tomorrow to define anatomy    2  HTN  Continue meds  Follow BP  3  HL  Continue lipitor

## 2017-06-02 NOTE — ED Triage Notes (Signed)
Pt sts generalized CP starting last night; pt sts hx of CABG

## 2017-06-02 NOTE — ED Provider Notes (Signed)
Medical screening examination/treatment/procedure(s) were conducted as a shared visit with non-physician practitioner(s) and myself.  I personally evaluated the patient during the encounter.   EKG Interpretation  Date/Time:  Sunday June 02 2017 10:14:51 EDT Ventricular Rate:  67 PR Interval:  122 QRS Duration: 82 QT Interval:  430 QTC Calculation: 454 R Axis:   81 Text Interpretation:  Normal sinus rhythm Possible Left atrial enlargement Cannot rule out Anteroseptal infarct , age undetermined Marked ST abnormality, possible inferior subendocardial injury Abnormal ECG Confirmed by Vanetta Mulders [phone]) on 06/02/2017 10:23:50 AM     Patient here complaining of chest pain was began yesterday has been persistent. Similar to his anginal equivalent. EKG shows ischemic changes in the inferior lateral leads. Troponin is elevated. Will start patient on heparin. He is currently pain-free and will consult cardiology for admission   Lorre Nick, MD 06/02/17 1112

## 2017-06-02 NOTE — ED Provider Notes (Signed)
[facility]-EMER[facility]ENCY DEPT Provider Note   CSN: [id number] Arrival date & time: 06/02/17  1007     History   Chief Complaint Chief Complaint  Patient presents with  . Chest Pain    HPI Rodney Gregory is a 56 y.o. male.  The history is provided by the patient and medical records. No language interpreter was used.  Chest Pain     Rodney Gregory is a 56 y.o. male  with a PMH of CAD s/p CABG x3, HTN, HLD, DM who presents to the Emergency Department complaining of constant, aching right-sided chest pain which began last night while sitting. Patient states that pain today feels similar to his previous chest pain when he required CABG. Denies associated shortness of breath, nausea, vomiting, diaphoresis, abdominal pain, back pain, fevers. Took nitro every hour x 4 hours with little improvement in pain. Pain initially 8/10 last night. Down to 2/10 currently. Now just feels like a dull ache.   Past Medical History:  Diagnosis Date  . Anemia    hx of blood loss anemia  . Coronary artery disease   . Diabetes mellitus 12/18/2011  . Hyperlipidemia   . Hypertension   . Myocardial infarction [facility])     Patient Active Problem List   Diagnosis Date Noted  . NSTEMI (non-ST elevated myocardial infarction) (HCC) 06/02/2017  . Diabetes mellitus 12/18/2011  . Hyponatremia 12/18/2011  . HTN (hypertension) 12/18/2011  . Hyperlipidemia 12/18/2011  . CAD (coronary artery disease) 12/18/2011  . S/P CABG x 3 12/18/2011  . Old MI (myocardial infarction) 12/18/2011    Past Surgical History:  Procedure Laterality Date  . BACK SURGERY    . CORONARY ARTERY BYPASS GRAFT  02/2010  . ROTATOR CUFF REPAIR  2009   right       Home Medications    Prior to Admission medications   Medication Sig Start Date End Date Taking? Authorizing Provider  amLODipine (NORVASC) 2.5 MG tablet Take 1 tablet (2.5 mg total) by mouth daily. 05/15/16   Tereso Newcomer T, PA-C  aspirin EC 81 MG tablet Take 1 tablet (81 mg total)  by mouth daily. DO NOT START UNTIL FINISHED WITH 325 MG ASPIRIN 02/01/16   Tereso Newcomer T, PA-C  atorvastatin (LIPITOR) 10 MG tablet Take 10 mg by mouth daily.    [provider]  carvedilol (COREG) 6.25 MG tablet TAKE 1.5 TABLETS BY MOUTH 2 TIMES DAILY WITH A MEAL. 02/18/17   Alben Spittle, Scott T, PA-C  glimepiride (AMARYL) 2 MG tablet Take 2 mg by mouth daily. 03/22/16   [provider]  lisinopril (PRINIVIL,ZESTRIL) 10 MG tablet Take 10 mg by mouth daily. 05/09/17   [provider]  losartan-hydrochlorothiazide (HYZAAR) 50-12.5 MG tablet 1 TABLET ONCE A DAY ORALLY 30 02/27/16   [provider]  nitroGLYCERIN (NITROSTAT) 0.4 MG SL tablet Place 1 tablet (0.4 mg total) under the tongue every 5 (five) minutes as needed for chest pain. 05/16/17   [name], MD    Family History Family History  Problem Relation Age of Onset  . Heart attack Mother   . Hypertension Mother   . Diabetes Mother   . Hypertension Father     Social History Social History  Substance Use Topics  . Smoking status: Former Smoker    Types: Cigarettes  . Smokeless tobacco: Never Used  . Alcohol use No     Allergies   Ace inhibitors; Imdur [isosorbide mononitrate]; and Isosorbide nitrate   Review of Systems Review  of Systems  Cardiovascular: Positive for chest pain.  All other systems reviewed and are negative.    Physical Exam Updated Vital Signs BP (!) 139/100 (BP Location: Right Arm)   Pulse 73   Temp 98.3 F (36.8 C) (Oral)   Resp 17   Wt 94.3 kg (208 lb)   SpO2 97%   BMI 31.17 kg/m   Physical Exam  Constitutional: He is oriented to person, place, and time. He appears well-developed and well-nourished. No distress.  HENT:  Head: Normocephalic and atraumatic.  Cardiovascular: Normal rate, regular rhythm and normal heart sounds.   Pulmonary/Chest: Effort normal and breath sounds normal. No respiratory distress. He has no wheezes. He has no rales. He exhibits no  tenderness.  Abdominal: Soft. He exhibits no distension. There is no tenderness.  Musculoskeletal: He exhibits no edema.  Neurological: He is alert and oriented to person, place, and time.  Skin: Skin is warm and dry.  Nursing note and vitals reviewed.    ED Treatments / Results  Labs (all labs ordered are listed, but only abnormal results are displayed) Labs Reviewed  BASIC METABOLIC PANEL - Abnormal; Notable for the following:       Result Value   Glucose, Bld 169 (*)    All other components within normal limits  CBC - Abnormal; Notable for the following:    Platelets 144 (*)    All other components within normal limits  I-STAT TROPOININ, ED - Abnormal; Notable for the following:    Troponin i, poc 3.21 (*)    All other components within normal limits  PROTIME-INR  APTT  HEPARIN LEVEL (UNFRACTIONATED)    EKG  EKG Interpretation  Date/Time:  Sunday June 02 2017 10:14:51 EDT Ventricular Rate:  67 PR Interval:  122 QRS Duration: 82 QT Interval:  430 QTC Calculation: 454 R Axis:   81 Text Interpretation:  Normal sinus rhythm Possible Left atrial enlargement Cannot rule out Anteroseptal infarct , age undetermined Marked ST abnormality, possible inferior subendocardial injury Abnormal ECG Confirmed by Vanetta Mulders [phone]) on 06/02/2017 10:23:50 AM       Radiology Dg Chest 2 View  Result Date: 06/02/2017 CLINICAL DATA:  Right chest pain EXAM: CHEST  2 VIEW COMPARISON:  12/18/2011 FINDINGS: Lungs are clear.  No pleural effusion or pneumothorax. Mild cardiomegaly.  Postsurgical changes related to prior CABG. Degenerative changes of the visualized thoracolumbar spine. Median sternotomy. IMPRESSION: Normal chest radiographs. Electronically Signed   By: Charline Bills M.D.   On: 06/02/2017 10:37    Procedures Procedures (including critical care time)  CRITICAL CARE Performed by: Chase Picket Ward  Total critical care time: 35 minutes  Critical care time was  exclusive of separately billable procedures and treating other patients.  Critical care was necessary to treat or prevent imminent or life-threatening deterioration.  Critical care was time spent personally by me on the following activities: development of treatment plan with patient and/or surrogate as well as nursing, discussions with consultants, evaluation of patient's response to treatment, examination of patient, obtaining history from patient or surrogate, ordering and performing treatments and interventions, ordering and review of laboratory studies, ordering and review of radiographic studies, pulse oximetry and re-evaluation of patient's condition.   Medications Ordered in ED Medications  heparin ADULT infusion 100 units/mL (25000 units/22mL sodium chloride 0.45%) (1,100 Units/hr Intravenous New Bag/Given 06/02/17 1149)  aspirin chewable tablet 324 mg (324 mg Oral Given 06/02/17 1058)  heparin bolus via infusion 4,000 Units (4,000 Units Intravenous Bolus from  Bag 06/02/17 1155)     Initial Impression / Assessment and Plan / ED Course  I have reviewed the triage vital signs and the nursing notes.  Pertinent labs & imaging results that were available during my care of the patient were reviewed by me and considered in my medical decision making (see chart for details).    Rodney Gregory is a 56 y.o. male who presents to ED for chest pain which feels similar to pain he experienced when requiring prior CABG. EKG with new t-wave changes changes. Troponin 3.21. Heparin bolus and gtt initiated. Cardiology consulted who will admit.   Patient seen by and discussed with Dr. Freida Busman who agrees with treatment plan.    Final Clinical Impressions(s) / ED Diagnoses   Final diagnoses:  NSTEMI (non-ST elevated myocardial infarction) [facility])    New Prescriptions New Prescriptions   No medications on file     Ward, Chase Picket, [place] 06/02/17 1446

## 2017-06-02 NOTE — Progress Notes (Signed)
ANTICOAGULATION CONSULT NOTE - Initial Consult  Pharmacy Consult for heparin Indication: chest pain/ACS  Allergies  Allergen Reactions  . Ace Inhibitors Other (See Comments)    headaches  . Imdur [Isosorbide Mononitrate] Other (See Comments)    Chest pain  . Isosorbide Nitrate Other (See Comments)    headaches    Patient Measurements:   Heparin Dosing Weight: 89 kg   Vital Signs: Temp: 98.3 F (36.8 C) (06/10 1015) Temp Source: Oral (06/10 1015) BP: 139/100 (06/10 1043) Pulse Rate: 73 (06/10 1043)  Labs:  Recent Labs  06/02/17 1045  HGB 14.1  HCT 41.3  PLT 144*     Medical History: Past Medical History:  Diagnosis Date  . Anemia    hx of blood loss anemia  . Coronary artery disease   . Diabetes mellitus 12/18/2011  . Hyperlipidemia   . Hypertension   . Myocardial infarction [facility])      Assessment: 56 yo male admitted to the ED on 6/10 for chest pain. No anticoagulation at home. Troponins are elevated and EKG shows ischemic changes in the inferior lateral leads. Pharmacy has been consulted for heparin dosing. Baseline H/H is stable. Platelets slightly low. No signs of bleeding noted.   Goal of Therapy:  Heparin level 0.3-0.7 units/ml Monitor platelets by anticoagulation protocol: Yes   Plan:  Heparin 4000 unit bolus  Heparin at 1100 units/hr  6 hour heparin level Daily heparin level/CBC Monitor for signs/symptoms of bleeding   Hillis Range, PharmD PGY1 Pharmacy Resident Pager: [phone] 06/02/2017,11:13 AM

## 2017-06-02 NOTE — Plan of Care (Signed)
Problem: Cardiac: Goal: Ability to achieve and maintain adequate cardiovascular perfusion will improve Outcome: Progressing Monitor pain, telemetry, and VS routine and prn.

## 2017-06-02 NOTE — ED Notes (Signed)
Pt remains painfree

## 2017-06-02 NOTE — Progress Notes (Signed)
ANTICOAGULATION CONSULT NOTE   Pharmacy Consult for heparin Indication: chest pain/ACS  Allergies  Allergen Reactions  . Ace Inhibitors Other (See Comments)    headaches  . Imdur [Isosorbide Mononitrate] Other (See Comments)    Chest pain  . Isosorbide Nitrate Other (See Comments)    headaches    Patient Measurements: Height: 5\' 8"  (172.7 cm) Weight: 206 lb 5.6 oz (93.6 kg) IBW/kg (Calculated) : 68.4 Heparin Dosing Weight: 89 kg   Vital Signs: Temp: 97.7 F (36.5 C) (06/10 1550) Temp Source: Oral (06/10 1550) BP: 155/99 (06/10 1520) Pulse Rate: 67 (06/10 1550)  Labs:  Recent Labs  06/02/17 1045 06/02/17 1724  HGB 14.1  --   HCT 41.3  --   PLT 144*  --   HEPARINUNFRC  --  0.35  CREATININE 1.11  --      Medical History: Past Medical History:  Diagnosis Date  . Anemia    hx of blood loss anemia  . Coronary artery disease   . Diabetes mellitus 12/18/2011  . Hyperlipidemia   . Hypertension   . Myocardial infarction [facility])      Assessment: 56 yo male admitted to the ED on 6/10 for chest pain. No anticoagulation at home. Troponins are elevated and EKG shows ischemic changes in the inferior lateral leads. Pharmacy has been consulted for heparin dosing. Baseline H/H is stable. Platelets slightly low. No signs of bleeding noted.   Initial heparin level is therapeutic at 0.35.   Goal of Therapy:  Heparin level 0.3-0.7 units/ml Monitor platelets by anticoagulation protocol: Yes    Plan:  1. Continue heparin infusion at 1100 units/hr  2. Confirmatory heparin level with am labs 3. Cath in am    Pollyann Samples, PharmD, BCPS 06/02/2017, 6:13 PM

## 2017-06-02 NOTE — ED Notes (Signed)
Report given to [address] RN

## 2017-06-03 ENCOUNTER — Encounter (HOSPITAL_COMMUNITY)
Admission: EM | Disposition: A | Discharge status: Home / Self Care | Payer: Self-pay | Source: Home / Self Care | Attending: Internal Medicine

## 2017-06-03 ENCOUNTER — Encounter (HOSPITAL_COMMUNITY): Payer: Self-pay | Admitting: Interventional Cardiology

## 2017-06-03 DIAGNOSIS — E785 Hyperlipidemia, unspecified: Secondary | ICD-10-CM

## 2017-06-03 DIAGNOSIS — I214 Non-ST elevation (NSTEMI) myocardial infarction: Principal | ICD-10-CM

## 2017-06-03 DIAGNOSIS — I25709 Atherosclerosis of coronary artery bypass graft(s), unspecified, with unspecified angina pectoris: Secondary | ICD-10-CM

## 2017-06-03 HISTORY — PX: LEFT HEART CATH AND CORS/GRAFTS ANGIOGRAPHY: CATH118250

## 2017-06-03 LAB — CBC
HCT: 40.1 % (ref 39.0–52.0)
HCT: 41.1 % (ref 39.0–52.0)
HEMOGLOBIN: 13.5 g/dL (ref 13.0–17.0)
Hemoglobin: 13.8 g/dL (ref 13.0–17.0)
MCH: 32.5 pg (ref 26.0–34.0)
MCH: 32.5 pg (ref 26.0–34.0)
MCHC: 33.7 g/dL (ref 30.0–36.0)
MCHC: 33.6 g/dL (ref 30.0–36.0)
MCV: 96.4 fL (ref 78.0–100.0)
MCV: 96.9 fL (ref 78.0–100.0)
PLATELETS: 149 10*3/uL — AB (ref 150–400)
Platelets: 127 K/uL — ABNORMAL LOW (ref 150–400)
RBC: 4.16 MIL/uL — AB (ref 4.22–5.81)
RBC: 4.24 MIL/uL (ref 4.22–5.81)
RDW: 13.2 % (ref 11.5–15.5)
RDW: 13.2 % (ref 11.5–15.5)
WBC: 10.4 10*3/uL (ref 4.0–10.5)
WBC: 11.7 K/uL — ABNORMAL HIGH (ref 4.0–10.5)

## 2017-06-03 LAB — TROPONIN I
TROPONIN I: 16 ng/mL — AB (ref <0.03)
Troponin I: 18.51 ng/mL (ref <0.03)

## 2017-06-03 LAB — HIV ANTIBODY (ROUTINE TESTING W REFLEX): HIV Screen 4th Generation wRfx: NONREACTIVE

## 2017-06-03 LAB — CREATININE, SERUM
Creatinine, Ser: 1.07 mg/dL (ref 0.61–1.24)
GFR calc Af Amer: >60 mL/min (ref >60)

## 2017-06-03 LAB — GLUCOSE, CAPILLARY: GLUCOSE-CAPILLARY: 104 mg/dL — AB (ref 65–99)

## 2017-06-03 LAB — BASIC METABOLIC PANEL
ANION GAP: 7 (ref 5–15)
BUN: 12 mg/dL (ref 6–20)
CHLORIDE: 108 mmol/L (ref 101–111)
CO2: 25 mmol/L (ref 22–32)
Calcium: 8.8 mg/dL — ABNORMAL LOW (ref 8.9–10.3)
Creatinine, Ser: 1.14 mg/dL (ref 0.61–1.24)
GFR calc Af Amer: >60 mL/min (ref >60)
Glucose, Bld: 155 mg/dL — ABNORMAL HIGH (ref 65–99)
POTASSIUM: 3.7 mmol/L (ref 3.5–5.1)
SODIUM: 140 mmol/L (ref 135–145)

## 2017-06-03 LAB — HEPARIN LEVEL (UNFRACTIONATED): Heparin Unfractionated: 0.29 IU/mL — ABNORMAL LOW (ref 0.30–0.70)

## 2017-06-03 LAB — LIPID PANEL
CHOLESTEROL: 119 mg/dL (ref 0–200)
HDL: 23 mg/dL — AB (ref >40)
LDL CALC: 57 mg/dL (ref 0–99)
TRIGLYCERIDES: 195 mg/dL — AB (ref <150)
Total CHOL/HDL Ratio: 5.2 RATIO
VLDL: 39 mg/dL (ref 0–40)

## 2017-06-03 LAB — POCT ACTIVATED CLOTTING TIME: Activated Clotting Time: 131 seconds

## 2017-06-03 SURGERY — LEFT HEART CATH AND CORS/GRAFTS ANGIOGRAPHY
Anesthesia: LOCAL

## 2017-06-03 MED ORDER — LIDOCAINE HCL (PF) 1 % IJ SOLN
INTRAMUSCULAR | Status: DC | PRN
  Administered 2017-06-03: 30 mL

## 2017-06-03 MED ORDER — ACETAMINOPHEN 325 MG PO TABS: 650.0000 mg | ORAL_TABLET | ORAL | Status: DC | PRN

## 2017-06-03 MED ORDER — ATORVASTATIN CALCIUM 80 MG PO TABS
80.0000 mg | ORAL_TABLET | Freq: Every day | ORAL | Status: DC
  Administered 2017-06-04: 80 mg via ORAL
  Filled 2017-06-03: qty 1

## 2017-06-03 MED ORDER — CLOPIDOGREL BISULFATE 75 MG PO TABS
75.0000 mg | ORAL_TABLET | Freq: Every day | ORAL | Status: DC
  Administered 2017-06-04: 75 mg via ORAL
  Filled 2017-06-03: qty 1

## 2017-06-03 MED ORDER — ENOXAPARIN SODIUM 40 MG/0.4ML ~~LOC~~ SOLN
40.0000 mg | SUBCUTANEOUS | Status: DC
  Administered 2017-06-04: 40 mg via SUBCUTANEOUS
  Filled 2017-06-03: qty 0.4

## 2017-06-03 MED ORDER — ASPIRIN 81 MG PO CHEW: 81.0000 mg | CHEWABLE_TABLET | Freq: Every day | ORAL | Status: DC

## 2017-06-03 MED ORDER — FENTANYL CITRATE (PF) 100 MCG/2ML IJ SOLN
INTRAMUSCULAR | Status: AC
  Filled 2017-06-03: qty 2

## 2017-06-03 MED ORDER — LIDOCAINE HCL 1 % IJ SOLN
INTRAMUSCULAR | Status: AC
  Filled 2017-06-03: qty 20

## 2017-06-03 MED ORDER — IOPAMIDOL (ISOVUE-370) INJECTION 76%
INTRAVENOUS | Status: DC | PRN
  Administered 2017-06-03: 145 mL via INTRA_ARTERIAL

## 2017-06-03 MED ORDER — IOPAMIDOL (ISOVUE-370) INJECTION 76%
INTRAVENOUS | Status: AC
  Filled 2017-06-03: qty 125

## 2017-06-03 MED ORDER — OXYCODONE-ACETAMINOPHEN 5-325 MG PO TABS: 1.0000 | ORAL_TABLET | ORAL | Status: DC | PRN

## 2017-06-03 MED ORDER — HEPARIN (PORCINE) IN NACL 2-0.9 UNIT/ML-% IJ SOLN
INTRAMUSCULAR | Status: AC | PRN
  Administered 2017-06-03: 1000 mL

## 2017-06-03 MED ORDER — MIDAZOLAM HCL 2 MG/2ML IJ SOLN
INTRAMUSCULAR | Status: DC | PRN
  Administered 2017-06-03: 1 mg via INTRAVENOUS

## 2017-06-03 MED ORDER — FENTANYL CITRATE (PF) 100 MCG/2ML IJ SOLN
INTRAMUSCULAR | Status: DC | PRN
  Administered 2017-06-03: 50 ug via INTRAVENOUS

## 2017-06-03 MED ORDER — HEPARIN (PORCINE) IN NACL 2-0.9 UNIT/ML-% IJ SOLN
INTRAMUSCULAR | Status: AC
  Filled 2017-06-03: qty 500

## 2017-06-03 MED ORDER — MIDAZOLAM HCL 2 MG/2ML IJ SOLN
INTRAMUSCULAR | Status: AC
  Filled 2017-06-03: qty 2

## 2017-06-03 MED ORDER — LOSARTAN POTASSIUM 50 MG PO TABS
50.0000 mg | ORAL_TABLET | Freq: Every day | ORAL | Status: DC
  Administered 2017-06-04: 50 mg via ORAL
  Filled 2017-06-03: qty 1

## 2017-06-03 MED ORDER — ISOSORBIDE MONONITRATE 15 MG HALF TABLET
15.0000 mg | ORAL_TABLET | Freq: Every day | ORAL | Status: DC
  Filled 2017-06-03: qty 1

## 2017-06-03 MED ORDER — AMLODIPINE BESYLATE 5 MG PO TABS
5.0000 mg | ORAL_TABLET | Freq: Every day | ORAL | Status: DC
  Administered 2017-06-03 – 2017-06-04 (×2): 5 mg via ORAL
  Filled 2017-06-03 (×2): qty 1

## 2017-06-03 MED ORDER — SODIUM CHLORIDE 0.9% FLUSH
3.0000 mL | Freq: Two times a day (BID) | INTRAVENOUS | Status: DC
Start: 2017-06-03 — End: 2017-06-04
  Administered 2017-06-03 – 2017-06-04 (×3): 3 mL via INTRAVENOUS

## 2017-06-03 MED ORDER — LOSARTAN POTASSIUM 25 MG PO TABS
25.0000 mg | ORAL_TABLET | Freq: Every day | ORAL | Status: DC
  Administered 2017-06-03: 25 mg via ORAL
  Filled 2017-06-03: qty 1

## 2017-06-03 MED ORDER — IOPAMIDOL (ISOVUE-370) INJECTION 76%
INTRAVENOUS | Status: AC
  Filled 2017-06-03: qty 50

## 2017-06-03 MED ORDER — SODIUM CHLORIDE 0.9 % IV SOLN: 250.0000 mL | INTRAVENOUS | Status: DC | PRN

## 2017-06-03 MED ORDER — SODIUM CHLORIDE 0.9% FLUSH: 3.0000 mL | INTRAVENOUS | Status: DC | PRN

## 2017-06-03 MED ORDER — SODIUM CHLORIDE 0.9 % IV SOLN
INTRAVENOUS | Status: AC
  Administered 2017-06-03: 11:00:00 via INTRAVENOUS

## 2017-06-03 MED ORDER — ONDANSETRON HCL 4 MG/2ML IJ SOLN: 4.0000 mg | Freq: Four times a day (QID) | INTRAMUSCULAR | Status: DC | PRN

## 2017-06-03 SURGICAL SUPPLY — 12 items
CATH INFINITI 5 FR IM (CATHETERS) ×2 IMPLANT
CATH INFINITI 5FR MPB2 (CATHETERS) ×4 IMPLANT
COVER PRB 48X5XTLSCP FOLD TPE (BAG) ×1 IMPLANT
COVER PROBE 5X48 (BAG) ×1
KIT HEART LEFT (KITS) ×2 IMPLANT
PACK CARDIAC CATHETERIZATION (CUSTOM PROCEDURE TRAY) ×2 IMPLANT
PROTECTION STATION PRESSURIZED (MISCELLANEOUS) ×2
SHEATH PINNACLE 5F 10CM (SHEATH) ×2 IMPLANT
STATION PROTECTION PRESSURIZED (MISCELLANEOUS) ×1 IMPLANT
TRANSDUCER W/STOPCOCK (MISCELLANEOUS) ×2 IMPLANT
TUBING CIL FLEX 10 FLL-RA (TUBING) ×2 IMPLANT
WIRE EMERALD 3MM-J .035X150CM (WIRE) ×2 IMPLANT

## 2017-06-03 NOTE — Progress Notes (Signed)
Site area: Right Femoral artery, sheath x 1.  Site Prior to Removal: Level 0  Pressure Applied For 20 MINUTES   Minutes Beginning at 1000  Manual: Yes    Patient Status During Pull: Alert, oriented and pain free, without complaints.  Post Pull Groin Site: Level 0  Post Pull Instructions Given:Yes, verbalized understanding.  Post Pull Pulses Present: Yes   Dressing Applied:Sterile gauze and tegaderm.

## 2017-06-03 NOTE — Progress Notes (Signed)
Progress Note  Patient Name: Rodney Gregory Date of Encounter: 06/03/2017  Primary Cardiologist: Dr. [name]  Subjective   Had cath earlier today.; no CP  Inpatient Medications    Scheduled Meds: . aspirin EC  81 mg Oral Daily  . atorvastatin  10 mg Oral Daily  . carvedilol  6.25 mg Oral BID WC  . [START ON 06/04/2017] clopidogrel  75 mg Oral Q breakfast  . [START ON 06/04/2017] enoxaparin (LOVENOX) injection  40 mg Subcutaneous Q24H  . glimepiride  2 mg Oral Daily  . [START ON 06/04/2017] losartan  50 mg Oral Daily  . sodium chloride flush  3 mL Intravenous Q12H   Continuous Infusions: . sodium chloride     PRN Meds: sodium chloride, acetaminophen, nitroGLYCERIN, ondansetron (ZOFRAN) IV, oxyCODONE-acetaminophen, sodium chloride flush   Vital Signs    Vitals:   06/03/17 1015 06/03/17 1020 06/03/17 1033 06/03/17 1401  BP: (!) 154/81 (!) 151/92  119/73  Pulse: 73 69  74  Resp: (!) 23 19  20   Temp:   98.8 F (37.1 C) 100.1 F (37.8 C)  TempSrc:   Oral Oral  SpO2: 97% 95%  97%  Weight:      Height:        Intake/Output Summary (Last 24 hours) at 06/03/17 1906 Last data filed at 06/03/17 0501  Gross per 24 hour  Intake                3 ml  Output              275 ml  Net             -272 ml     Filed Weights   06/02/17 1100 06/02/17 1550 06/03/17 0440  Weight: 208 lb (94.3 kg) 206 lb 5.6 oz (93.6 kg) 207 lb (93.9 kg)    Telemetry    sinus- Personally Reviewed  ECG    ECG (independently read by me): NSR 67; QS v1-V2 inferolateral T wave inversion.  Physical Exam   BP 119/73   Pulse 74   Temp 100.1 F (37.8 C) (Oral)   Resp 20   Ht 5\' 8"  (1.727 m)   Wt 207 lb (93.9 kg)   SpO2 97%   BMI 31.47 kg/m  General: Alert, oriented, no distress.  Skin: normal turgor, no rashes, warm and dry HEENT: Normocephalic, atraumatic. Pupils equal round and reactive to light; sclera anicteric; extraocular muscles intact; Nose without nasal septal  hypertrophy Mouth/Parynx benign; Mallinpatti scale 3 Neck: No JVD, no carotid bruits; normal carotid upstroke Lungs: clear to ausculatation and percussion; no wheezing or rales Chest wall: without tenderness to palpitation Heart: PMI not displaced, RRR, s1 s2 normal, 1/6 systolic murmur, no diastolic murmur, no rubs, gallops, thrills, or heaves Abdomen: soft, nontender; no hepatosplenomehaly, BS+; abdominal aorta nontender and not dilated by palpation. Back: no CVA tenderness Pulses 2+; R groin site stable Musculoskeletal: full range of motion, normal strength, no joint deformities Extremities: no clubbing cyanosis or edema, Homan's sign negative  Neurologic: grossly nonfocal; Cranial nerves grossly wnl Psychologic: Normal mood and affect     Labs    Chemistry Recent Labs Lab 06/02/17 1045 06/03/17 0110 06/03/17 1054  NA 138 140  --   K 3.7 3.7  --   CL 108 108  --   CO2 23 25  --   GLUCOSE 169* 155*  --   BUN 9 12  --   CREATININE 1.11 1.14 1.07  CALCIUM 9.2 8.8*  --   GFRNONAA >60 >60 >60  GFRAA >60 >60 >60  ANIONGAP 7 7  --      Hematology Recent Labs Lab 06/02/17 1045 06/03/17 0110 06/03/17 1054  WBC 7.6 10.4 11.7*  RBC 4.30 4.16* 4.24  HGB 14.1 13.5 13.8  HCT 41.3 40.1 41.1  MCV 96.0 96.4 96.9  MCH 32.8 32.5 32.5  MCHC 34.1 33.7 33.6  RDW 13.3 13.2 13.2  PLT 144* 149* 127*    Cardiac Enzymes Recent Labs Lab 06/02/17 1931 06/03/17 0110 06/03/17 0647  TROPONINI 9.31* 16.00* 18.51*    Recent Labs Lab 06/02/17 1054  TROPIPOC 3.21*     BNPNo results for input(s): BNP, PROBNP in the last 168 hours.   DDimer No results for input(s): DDIMER in the last 168 hours.   Lipid Panel     Component Value Date/Time   CHOL 119 06/03/2017 0110   TRIG 195 (H) 06/03/2017 0110   HDL 23 (L) 06/03/2017 0110   CHOLHDL 5.2 06/03/2017 0110   VLDL 39 06/03/2017 0110   LDLCALC 57 06/03/2017 0110    Radiology    Dg Chest 2 View  Result Date:  06/02/2017 CLINICAL DATA:  Right chest pain EXAM: CHEST  2 VIEW COMPARISON:  12/18/2011 FINDINGS: Lungs are clear.  No pleural effusion or pneumothorax. Mild cardiomegaly.  Postsurgical changes related to prior CABG. Degenerative changes of the visualized thoracolumbar spine. Median sternotomy. IMPRESSION: Normal chest radiographs. Electronically Signed   By: Charline Bills M.D.   On: 06/02/2017 10:37    Cardiac Studies   Conclusion     Ost RPDA to RPDA lesion, 30 %stenosed.    None ST elevation myocardial infarction due to occlusion of the SVG to the large obtuse marginal #2. The marginal beyond the graft has TIMI grade 1-2 flow. Likely completed infarct distal to the graft insertion site due to graft occlusion and thrombotic embolization.  Bypass graft failure with occlusion of the SVG to the obtuse marginal (acute), and occlusion of the SVG to the diagonal (chronic).  Patent LIMA to the LAD  Severe native vessel disease with 99% stenosis of obtuse marginal #2 with TIMI grade 2 flow beyond including the portion of the vessel distal to the graft insertion site. The remainder of the circumflex contains irregularities but is widely patent.  Ostial to proximal LAD stent with diffuse 50% narrowing. The large first diagonal receives antegrade flow via the natural circulation and there is no evidence of significant obstruction.  Irregularities noted in the native right coronary. The vessel is widely patent.  Widely patent left main.  Left ventricular systolic dysfunction with estimated EF 35% and mildly elevated EDP.  RECOMMENDATIONS:  Medical therapy including long-acting nitrates, guideline based therapy for systolic heart failure, and aggressive risk factor modification.  If all medical therapy, angina is recurrent, he will need PCI of the native obtuse marginal #2. Not done today because it would jeopardize the more distal circumflex. With his late presentation and already  elevated markers and no ongoing chest discomfort with TIMI 2 flow, we feel the circumflex territory infarction has completed itself.     Patient Profile     56 y.o. male who is s/p CABG x 3 ansd has a history of DM, HTN, HLD who presented with unstable angina/NSTEMI  Assessment & Plan    1. CAD/s/p CABG with NSTEMI: cath findings as noted; now on coreg, will add amlodipine 5 mg to regimen (allergy to nitrates?, will need to  re-assess) ; DAPT indefinitiely   2. Ischemic cardiomyopathy;  BB, ARB,  3. HTN 4. Hyperlipidemia; increase lipitor to 80 mg 5. DM  Will re-assess in am and titrate meds as BP allows.   Signed, Lennette Bihari, MD, [facility] 06/03/2017, 7:06 PM

## 2017-06-03 NOTE — Progress Notes (Signed)
RN reported troponin of 18.51 to [place], [place] with cardiology.

## 2017-06-03 NOTE — Progress Notes (Signed)
Patient states that he cannot take Imdur as it gives him chest pain and a headache, this medication is listed under his allergies with reactions documented. RN notified cardiology PA with this information and PA stated Dr Tresa Endo would be by to see the patient today. Imdur discontinued by cardiology PA.

## 2017-06-03 NOTE — Progress Notes (Signed)
Troponin 16:00. Card Fellow on call [place] MD notified. No new order.  Will continue to monitor

## 2017-06-03 NOTE — Progress Notes (Signed)
Troponin 9.31; Card Fellow on call [place] MD made aware. No new order.  Heparin infusing per order.  Denies pain.  Will continue to monitor.

## 2017-06-03 NOTE — Care Management Note (Signed)
Case Management Note  Patient Details  Name: ALISHA DURR MRN: [id number] Date of Birth: [birth year]  Subjective/Objective:                 Patient from home with spouse. Independent prior to admission. Admitted with NSTEMI. Cardiac cath 06/03/17. Currently on ASA and Plavix.  PCP WONG, FRANCIS P     Action/Plan:  CM will continue to follow.  Expected Discharge Date:                  Expected Discharge Plan:  Home/Self Care  In-House Referral:     Discharge planning Services  CM Consult  Post Acute Care Choice:    Choice offered to:     DME Arranged:    DME Agency:     HH Arranged:    HH Agency:     Status of Service:  In process, will continue to follow  If discussed at Long Length of Stay Meetings, dates discussed:    Additional Comments:  Lawerance Sabal, RN 06/03/2017, 12:18 PM

## 2017-06-03 NOTE — Interval H&P Note (Signed)
Cath Lab Visit (complete for each Cath Lab visit)  Clinical Evaluation Leading to the Procedure:   ACS: Yes.    Non-ACS:    Anginal Classification: CCS III  Anti-ischemic medical therapy: Minimal Therapy (1 class of medications)  Non-Invasive Test Results: No non-invasive testing performed  Prior CABG: Previous CABG      History and Physical Interval Note:  06/03/2017 8:39 AM  Rodney Gregory  has presented today for surgery, with the diagnosis of NSETMI  The various methods of treatment have been discussed with the patient and family. After consideration of risks, benefits and other options for treatment, the patient has consented to  Procedure(s): Left Heart Cath and Cors/Grafts Angiography (N/A) as a surgical intervention .  The patient's history has been reviewed, patient examined, no change in status, stable for surgery.  I have reviewed the patient's chart and labs.  Questions were answered to the patient's satisfaction.     [name] III

## 2017-06-04 LAB — CBC
HCT: 39 % (ref 39.0–52.0)
HEMOGLOBIN: 13 g/dL (ref 13.0–17.0)
MCH: 31.9 pg (ref 26.0–34.0)
MCHC: 33.3 g/dL (ref 30.0–36.0)
MCV: 95.8 fL (ref 78.0–100.0)
Platelets: 134 10*3/uL — ABNORMAL LOW (ref 150–400)
RBC: 4.07 MIL/uL — AB (ref 4.22–5.81)
RDW: 13.2 % (ref 11.5–15.5)
WBC: 12.3 10*3/uL — ABNORMAL HIGH (ref 4.0–10.5)

## 2017-06-04 MED ORDER — ATORVASTATIN CALCIUM 80 MG PO TABS: 80.0000 mg | ORAL_TABLET | Freq: Every day | ORAL | 3 refills | Status: AC

## 2017-06-04 MED ORDER — CARVEDILOL 12.5 MG PO TABS: 12.5000 mg | ORAL_TABLET | Freq: Two times a day (BID) | ORAL | 3 refills | Status: DC

## 2017-06-04 MED ORDER — AMLODIPINE BESYLATE 2.5 MG PO TABS: 5.0000 mg | ORAL_TABLET | Freq: Every day | ORAL | 3 refills | Status: DC

## 2017-06-04 MED ORDER — CLOPIDOGREL BISULFATE 75 MG PO TABS: 75.0000 mg | ORAL_TABLET | Freq: Every day | ORAL | 6 refills | Status: DC

## 2017-06-04 NOTE — Discharge Summary (Signed)
Discharge Summary    Patient ID: Rodney Gregory,  MRN: [id number], DOB/AGE: 56-31-1962 56 y.o.  Admit date: 06/02/2017 Discharge date: 06/04/2017  Primary Care Provider: [name] Primary Cardiologist: [name]  Discharge Diagnoses    Principal Problem:   NSTEMI (non-ST elevated myocardial infarction) [facility]) Active Problems:   HTN (hypertension)   Hyperlipidemia   Coronary artery disease involving coronary bypass graft of native heart with angina pectoris (HCC)   S/P CABG x 3   Allergies Allergies  Allergen Reactions  . Ace Inhibitors Other (See Comments)    headaches  . Imdur [Isosorbide Mononitrate] Other (See Comments)    Chest pain  . Isosorbide Nitrate Other (See Comments)    headaches    Diagnostic Studies/Procedures    LHC: 06/03/17  Conclusion     Ost RPDA to RPDA lesion, 30 %stenosed.    None ST elevation myocardial infarction due to occlusion of the SVG to the large obtuse marginal #2. The marginal beyond the graft has TIMI grade 1-2 flow. Likely completed infarct distal to the graft insertion site due to graft occlusion and thrombotic embolization.  Bypass graft failure with occlusion of the SVG to the obtuse marginal (acute), and occlusion of the SVG to the diagonal (chronic).  Patent LIMA to the LAD  Severe native vessel disease with 99% stenosis of obtuse marginal #2 with TIMI grade 2 flow beyond including the portion of the vessel distal to the graft insertion site. The remainder of the circumflex contains irregularities but is widely patent.  Ostial to proximal LAD stent with diffuse 50% narrowing. The large first diagonal receives antegrade flow via the natural circulation and there is no evidence of significant obstruction.  Irregularities noted in the native right coronary. The vessel is widely patent.  Widely patent left main.  Left ventricular systolic dysfunction with estimated EF 35% and mildly elevated  EDP.  RECOMMENDATIONS:  Medical therapy including long-acting nitrates, guideline based therapy for systolic heart failure, and aggressive risk factor modification.  If all medical therapy, angina is recurrent, he will need PCI of the native obtuse marginal #2. Not done today because it would jeopardize the more distal circumflex. With his late presentation and already elevated markers and no ongoing chest discomfort with TIMI 2 flow, we feel the circumflex territory infarction has completed itself.   _____________   History of Present Illness     56 yo with history of CAD (s/p CABG  LIMA to LAD, SVG to LCx; SVG to Diag in 2011), HL, HTN and DM. He is followed in clinic by [facility]  Seen on 05/16/17  At that time he denied CP  Not too active, but was overall stable.     Friday he felt weak,  like he had too much sun and was washed out Saturday again felt weak, slightly nauseated. Went to a fish fry  Saturday night got home.  Started having chest discomfort. That felt like indigestion. He took several NTG without relief and still felt weak Presented to the ED on 06/02/17. Given 2 ASA he says and discomfort eased off  Pt noted no recent change in acitvity  No CP with activity. Trop in the ED was elevated at 3.21. He was started on IV heparin and referred for cardiac cath.    Hospital Course     Underwent cardiac cath with Dr. [name] noted above with recommendation for medical therapy given late presentation, and Lcx lesion completed infarction. Troponin peaked at 18.51.  He had no recurrent chest pain. Labs were stable post cath. Placed back on DAPT with ASA/plavix indefinitely. Planned originally for the addition of Imdur, but reports allergy in the past and unable to tolerate this mediation. Therefore amlodipine 5mg  daily was added. His blood pressure tolerated well, and no further episodes of chest pain. Was able to ambulate in the hallway without any discomfort. His Lipitor was increased from  10mg  to 80mg  this admission.   He was seen by Dr. Tresa Endo and determined stable for discharge home. Follow up in the office has been arranged. Medications are listed below.   General: Well developed, well nourished, male appearing in no acute distress. Head: Normocephalic, atraumatic.  Neck: Supple without bruits, JVD. Lungs:  Resp regular and unlabored, CTA. Heart: RRR, S1, S2, no S3, S4, or murmur; no rub. Abdomen: Soft, non-tender, non-distended with normoactive bowel sounds. No hepatomegaly. No rebound/guarding. No obvious abdominal masses. Extremities: No clubbing, cyanosis, edema. Distal pedal pulses are 2+ bilaterally. R groin cath site stable without bruising or hematoma Neuro: Alert and oriented X 3. Moves all extremities spontaneously. Psych: Normal affect.  _____________  Discharge Vitals Blood pressure 138/82, pulse 81, temperature 98.7 F (37.1 C), temperature source Oral, resp. rate 20, height 5\' 8"  (1.727 m), weight 204 lb 4.8 oz (92.7 kg), SpO2 97 %.  Filed Weights   06/02/17 1550 06/03/17 0440 06/04/17 0535  Weight: 206 lb 5.6 oz (93.6 kg) 207 lb (93.9 kg) 204 lb 4.8 oz (92.7 kg)    Labs & Radiologic Studies    CBC  Recent Labs  06/03/17 1054 06/04/17 0300  WBC 11.7* 12.3*  HGB 13.8 13.0  HCT 41.1 39.0  MCV 96.9 95.8  PLT 127* 134*   Basic Metabolic Panel  Recent Labs  06/02/17 1045 06/03/17 0110 06/03/17 1054  NA 138 140  --   K 3.7 3.7  --   CL 108 108  --   CO2 23 25  --   GLUCOSE 169* 155*  --   BUN 9 12  --   CREATININE 1.11 1.14 1.07  CALCIUM 9.2 8.8*  --    Liver Function Tests No results for input(s): AST, ALT, ALKPHOS, BILITOT, PROT, ALBUMIN in the last 72 hours. No results for input(s): LIPASE, AMYLASE in the last 72 hours. Cardiac Enzymes  Recent Labs  06/02/17 1931 06/03/17 0110 06/03/17 0647  TROPONINI 9.31* 16.00* 18.51*   BNP Invalid input(s): POCBNP D-Dimer No results for input(s): DDIMER in the last 72  hours. Hemoglobin A1C No results for input(s): HGBA1C in the last 72 hours. Fasting Lipid Panel  Recent Labs  06/03/17 0110  CHOL 119  HDL 23*  LDLCALC 57  TRIG 202*  CHOLHDL 5.2   Thyroid Function Tests No results for input(s): TSH, T4TOTAL, T3FREE, THYROIDAB in the last 72 hours.  Invalid input(s): FREET3 _____________  Dg Chest 2 View  Result Date: 06/02/2017 CLINICAL DATA:  Right chest pain EXAM: CHEST  2 VIEW COMPARISON:  12/18/2011 FINDINGS: Lungs are clear.  No pleural effusion or pneumothorax. Mild cardiomegaly.  Postsurgical changes related to prior CABG. Degenerative changes of the visualized thoracolumbar spine. Median sternotomy. IMPRESSION: Normal chest radiographs. Electronically Signed   By: Charline Bills M.D.   On: 06/02/2017 10:37   Disposition   Pt is being discharged home today in good condition.  Follow-up Plans & Appointments    Follow-up Information    Rosalio Macadamia, NP Follow up on 06/17/2017.   Specialties:  Nurse Practitioner, Interventional  Cardiology, Cardiology, Radiology Why:  at 10am for your follow up appt.  Contact information: [address] [place] [address] [phone]          Discharge Instructions    (HEART FAILURE PATIENTS) Call MD:  Anytime you have any of the following symptoms: 1) 3 pound weight gain in 24 hours or 5 pounds in 1 week 2) shortness of breath, with or without a dry hacking cough 3) swelling in the hands, feet or stomach 4) if you have to sleep on extra pillows at night in order to breathe.    Complete by:  As directed    Call MD for:  redness, tenderness, or signs of infection (pain, swelling, redness, odor or green/yellow discharge around incision site)    Complete by:  As directed    Diet - low sodium heart healthy    Complete by:  As directed    Discharge instructions    Complete by:  As directed    Groin Site Care Refer to this sheet in the next few weeks. These instructions  provide you with information on caring for yourself after your procedure. Your caregiver may also give you more specific instructions. Your treatment has been planned according to current medical practices, but problems sometimes occur. Call your caregiver if you have any problems or questions after your procedure. HOME CARE INSTRUCTIONS You may shower 24 hours after the procedure. Remove the bandage (dressing) and gently wash the site with plain soap and water. Gently pat the site dry.  Do not apply powder or lotion to the site.  Do not sit in a bathtub, swimming pool, or whirlpool for 5 to 7 days.  No bending, squatting, or lifting anything over 10 pounds (4.5 kg) as directed by your caregiver.  Inspect the site at least twice daily.  Do not drive home if you are discharged the same day of the procedure. Have someone else drive you.  You may drive 24 hours after the procedure unless otherwise instructed by your caregiver.  What to expect: Any bruising will usually fade within 1 to 2 weeks.  Blood that collects in the tissue (hematoma) may be painful to the touch. It should usually decrease in size and tenderness within 1 to 2 weeks.  SEEK IMMEDIATE MEDICAL CARE IF: You have unusual pain at the groin site or down the affected leg.  You have redness, warmth, swelling, or pain at the groin site.  You have drainage (other than a small amount of blood on the dressing).  You have chills.  You have a fever or persistent symptoms for more than 72 hours.  You have a fever and your symptoms suddenly get worse.  Your leg becomes pale, cool, tingly, or numb.  You have heavy bleeding from the site. Hold pressure on the site. .   Increase activity slowly    Complete by:  As directed       Discharge Medications   Discharge Medication List as of 06/04/2017  5:15 PM    START taking these medications   Details  clopidogrel (PLAVIX) 75 MG tablet Take 1 tablet (75 mg total) by mouth daily with  breakfast., Starting Wed 06/05/2017, Normal      CONTINUE these medications which have CHANGED   Details  amLODipine (NORVASC) 2.5 MG tablet Take 2 tablets (5 mg total) by mouth daily., Starting Tue 06/04/2017, Normal    atorvastatin (LIPITOR) 80 MG tablet Take 1 tablet (80 mg total) by  mouth daily., Starting Wed 06/05/2017, Normal    carvedilol (COREG) 12.5 MG tablet Take 1 tablet (12.5 mg total) by mouth 2 (two) times daily with a meal., Starting Tue 06/04/2017, Normal      CONTINUE these medications which have NOT CHANGED   Details  aspirin EC 81 MG tablet Take 1 tablet (81 mg total) by mouth daily. DO NOT START UNTIL FINISHED WITH 325 MG ASPIRIN, Starting Wed 02/01/2016, OTC    glimepiride (AMARYL) 2 MG tablet Take 2 mg by mouth daily., Starting Thu 03/22/2016, Historical Med    losartan-hydrochlorothiazide (HYZAAR) 50-12.5 MG tablet 1 TABLET ONCE A DAY ORALLY 30, Historical Med    nitroGLYCERIN (NITROSTAT) 0.4 MG SL tablet Place 1 tablet (0.4 mg total) under the tongue every 5 (five) minutes as needed for chest pain., Starting Thu 05/16/2017, Normal      STOP taking these medications     lisinopril (PRINIVIL,ZESTRIL) 10 MG tablet          Aspirin prescribed at discharge? Yes High Intensity Statin Prescribed? (Lipitor 40-80mg  or Crestor 20-40mg ) yes Beta Blocker Prescribed? yes For EF <40%, was ACEI/ARB Prescribed? yes ADP Receptor Inhibitor Prescribed? yes For EF <40%, Aldosterone Inhibitor Prescribed? No consider as outpatient Was EF assessed during THIS hospitalization? yes Was Cardiac Rehab II ordered? Yes   Outstanding Labs/Studies   FLP/LFTs in 6 weeks if tolerating statin increase  Duration of Discharge Encounter   Greater than 30 minutes including physician time.  Signed, Laverda Page NP-C 06/04/2017, 6:34 PM    Patient seen and examined. Agree with assessment and plan. Feels well, no recurrent chest pain. Tolerating med adjustment.  BP well controlled. Pulse  in the mid to upper 80s. Will further titrate coreg to 12.5 mg bid.  OK for dc today with f/u with Dr. [name].   Lennette Bihari, MD, [facility] 06/04/2017 6:34 PM

## 2017-06-06 ENCOUNTER — Telehealth: Payer: Self-pay | Admitting: Interventional Cardiology

## 2017-06-06 MED ORDER — LOSARTAN POTASSIUM-HCTZ 50-12.5 MG PO TABS: 1.0000 | ORAL_TABLET | Freq: Every day | ORAL | 3 refills | Status: DC

## 2017-06-06 NOTE — Telephone Encounter (Signed)
New message    Pt is calling asking for RN to call. He was just discharged from hospital this week and wants to go over his medication.

## 2017-06-06 NOTE — Telephone Encounter (Signed)
Spoke with pt and advised him to use Tylenol for HAs. Pt verbalized understanding and was appreciative for call.

## 2017-06-06 NOTE — Telephone Encounter (Signed)
Spoke with pt and he states that his d/c med list shows losartan/hctz but he doesn't have any.  Advised pt he does need to continue this medication so I will send a prescription to his preferred pharmacy.  Pt verbalized understanding and was appreciative for call.

## 2017-06-06 NOTE — Telephone Encounter (Signed)
Patient calling, states that he was prescribed to Plavix medication and would like to know which medications he could take for "an headache or if I were to get a cold?"  Patient requests that if he does not answer that you please leave a detailed message,thanks.

## 2017-06-17 ENCOUNTER — Encounter: Payer: Self-pay | Admitting: Cardiology

## 2017-06-17 ENCOUNTER — Ambulatory Visit (INDEPENDENT_AMBULATORY_CARE_PROVIDER_SITE_OTHER): Payer: Managed Care, Other (non HMO) | Admitting: Cardiology

## 2017-06-17 ENCOUNTER — Encounter (INDEPENDENT_AMBULATORY_CARE_PROVIDER_SITE_OTHER): Payer: Self-pay

## 2017-06-17 ENCOUNTER — Other Ambulatory Visit: Payer: Self-pay | Admitting: *Deleted

## 2017-06-17 VITALS — BP 130/78 | HR 68 | Ht 68.0 in | Wt 204.2 lb

## 2017-06-17 DIAGNOSIS — E785 Hyperlipidemia, unspecified: Secondary | ICD-10-CM | POA: Diagnosis not present

## 2017-06-17 DIAGNOSIS — I10 Essential (primary) hypertension: Secondary | ICD-10-CM

## 2017-06-17 DIAGNOSIS — I255 Ischemic cardiomyopathy: Secondary | ICD-10-CM | POA: Diagnosis not present

## 2017-06-17 DIAGNOSIS — E119 Type 2 diabetes mellitus without complications: Secondary | ICD-10-CM

## 2017-06-17 DIAGNOSIS — I25810 Atherosclerosis of coronary artery bypass graft(s) without angina pectoris: Secondary | ICD-10-CM

## 2017-06-17 MED ORDER — CARVEDILOL 12.5 MG PO TABS: 12.5000 mg | ORAL_TABLET | Freq: Two times a day (BID) | ORAL | 2 refills | Status: DC

## 2017-06-17 MED ORDER — AMLODIPINE BESYLATE 5 MG PO TABS: 5.0000 mg | ORAL_TABLET | Freq: Every day | ORAL | 2 refills | Status: DC

## 2017-06-17 NOTE — Patient Instructions (Signed)
Medication Instructions:  Your physician has recommended you make the following change in your medication:  1. Amlodipine ( 5 mg ) daily was sent in today, Go by list given today. 2. Lisinopril was discontinue at hospital.    Labwork: -None  Testing/Procedures: -None  Follow-Up: Your physician recommends that you keep your scheduled  follow-up appointment with Dr. [name]. Bring all your medications to your next visit.    Any Other Special Instructions Will Be Listed Below (If Applicable).  [facility] to see if [facility] are any prescriptions still there to be picked up. Go home and call us at [phone] after you check list of medications.  Safety- Rise slowly from seated position.   If you need a refill on your cardiac medications before your next appointment, please call your pharmacy.

## 2017-06-17 NOTE — Progress Notes (Addendum)
Cardiology Office Note:    Date:  06/17/2017   ID:  KILLIAN DOUBEK, DOB 07-29-1961, MRN [id number]  PCP:  [name], MD  Cardiologist:  Dr. [name]  Referring MD: [name], MD   Chief Complaint  Patient presents with  . Hospitalization Follow-up    Non-STEMI    History of Present Illness:    Rodney Gregory is a 56 y.o. male with a hx of CAD status post CABG in 2011, hyperlipidemia, hypertension and diabetes. He is here alone for follow-up of hospitalization 06/02/2017 to 06/04/2017.   On 06/02/2017 the patient had been out in the sun and began feeling weak and later that night developed chest pain. The patient had elevated troponins and was diagnosed with non-STEMI. On 06/03/17 the patient was taken to the Cath Lab where he was found to have occlusion of the saphenous vein graft to the large OM #2. Likely completed infarction distal to the graft insertion site due to graft occlusion and thrombotic embolization. Medical therapy was recommended. The desire was to initiate Imdur however the patient has had problems tolerating Imdur in the past and so he was started on amlodipine 5 mg daily. His blood pressure tolerated that well and he had no further episodes of chest pain. His Lipitor was increased from 10 mg to 80 mg daily. Coreg was titrated up to 12.5 mg twice a day. However, the patient is unsure of his medications and it appears that he may not have made some of the changes that were instituted. His pharmacy has been contacted and it is still unclear as to what the patient has picked up and has taken. He will be instructed to call us back once he gets home and review all of the medications that he is taking.  The patient denies having any further chest pain or dyspnea since his MI. He's having no orthopnea, PND or lower extremity edema. He is able to climb a flight of stairs without problems. He denies palpitations. He is having some lightheadedness upon standing that resolves  quickly. He also notes sweating when he sleeps. It does appear that he has been taking both lisinopril and losartan and the patient will call us back once he gets home and checks. He does know that he is taking his Plavix and aspirin.   Past Medical History:  Diagnosis Date  . Anemia    hx of blood loss anemia  . Coronary artery disease   . Diabetes mellitus 12/18/2011  . Hyperlipidemia   . Hypertension   . Myocardial infarction [facility])     Past Surgical History:  Procedure Laterality Date  . BACK SURGERY    . CORONARY ARTERY BYPASS GRAFT  02/2010  . LEFT HEART CATH AND CORS/GRAFTS ANGIOGRAPHY N/A 06/03/2017   Procedure: Left Heart Cath and Cors/Grafts Angiography;  Surgeon: [name], MD;  Location: [facility] INVASIVE CV LAB;  Service: Cardiovascular;  Laterality: N/A;  . ROTATOR CUFF REPAIR  2009   right    Current Medications: No outpatient prescriptions have been marked as taking for the 06/17/17 encounter (Office Visit) with Rodney Bon, NP.     Allergies:   Ace inhibitors; Imdur [isosorbide mononitrate]; and Isosorbide nitrate   Social History   Social History  . Marital status: Married    Spouse name: N/A  . Number of children: N/A  . Years of education: N/A   Social History Main Topics  . Smoking status: Former Smoker    Types: Cigarettes  .  Smokeless tobacco: Never Used  . Alcohol use No  . Drug use: No  . Sexual activity: Yes   Other Topics Concern  . Not on file   Social History Narrative  . No narrative on file     Family History: The patient's family history includes Diabetes in his mother; Heart attack in his mother; Hypertension in his father and mother.   ROS:   Please see the history of present illness.     All other systems reviewed and are negative.  EKGs/Labs/Other Studies Reviewed:    The following studies were reviewed today:   LHC: 06/03/17  Conclusion     Ost RPDA to RPDA lesion, 30 %stenosed.   None ST elevation  myocardial infarction due to occlusion of the SVG to the large obtuse marginal #2. The marginal beyond the graft has TIMI grade 1-2 flow. Likely completed infarct distal to the graft insertion site due to graft occlusion and thrombotic embolization.  Bypass graft failure with occlusion of the SVG to the obtuse marginal (acute), and occlusion of the SVG to the diagonal (chronic).  Patent LIMA to the LAD  Severe native vessel disease with 99% stenosis of obtuse marginal #2 with TIMI grade 2 flow beyond including the portion of the vessel distal to the graft insertion site. The remainder of the circumflex contains irregularities but is widely patent.  Ostial to proximal LAD stent with diffuse 50% narrowing. The large first diagonal receives antegrade flow via the natural circulation and there is no evidence of significant obstruction.  Irregularities noted in the native right coronary. The vessel is widely patent.  Widely patent left main.  Left ventricular systolic dysfunction with estimated EF 35% and mildly elevated EDP.  RECOMMENDATIONS:  Medical therapy including long-acting nitrates, guideline based therapy for systolic heart failure, and aggressive risk factor modification.  If all medical therapy, angina is recurrent, he will need PCI of the native obtuse marginal #2. Not done today because it would jeopardize the more distal circumflex. With his late presentation and already elevated markers and no ongoing chest discomfort with TIMI 2 flow, we feel the circumflex territory infarction has completed itself.     EKG:  EKG is not ordered today.    Recent Labs: 06/03/2017: BUN 12; Creatinine, Ser 1.07; Potassium 3.7; Sodium 140 06/04/2017: Hemoglobin 13.0; Platelets 134   Recent Lipid Panel    Component Value Date/Time   CHOL 119 06/03/2017 0110   TRIG 195 (H) 06/03/2017 0110   HDL 23 (L) 06/03/2017 0110   CHOLHDL 5.2 06/03/2017 0110   VLDL 39 06/03/2017 0110   LDLCALC 57  06/03/2017 0110    Physical Exam:    VS:  BP 130/78 (BP Location: Left Arm, Patient Position: Sitting, Cuff Size: Normal)   Pulse 68   Ht 5\' 8"  (1.727 m)   Wt 204 lb 3.2 oz (92.6 kg)   BMI 31.05 kg/m     Wt Readings from Last 3 Encounters:  06/17/17 204 lb 3.2 oz (92.6 kg)  06/04/17 204 lb 4.8 oz (92.7 kg)  05/16/17 209 lb 12.8 oz (95.2 kg)     GEN:  Well nourished, well developed in no acute distress HEENT: Normal NECK: No JVD; No carotid bruits LYMPHATICS: No lymphadenopathy CARDIAC: RRR, no murmurs, rubs, gallops RESPIRATORY:  Clear to auscultation without rales, wheezing or rhonchi  ABDOMEN: Soft, non-tender, non-distended MUSCULOSKELETAL:  No edema; No deformity  SKIN: Warm and dry NEUROLOGIC:  Alert and oriented x 3 PSYCHIATRIC:  Normal affect  ASSESSMENT:    1. Essential hypertension    PLAN:    In order of problems listed above:  1. CAD -hx of CAD status post CABG in 2011, recent non-STEMI on 06/02/2017 with cardiac catheterization revealing occluded SVG to OM. No intervention was done as patient had late presenting completed MI and medical therapy was recommended -No further chest pain or dyspnea. No heart failure type symptoms. He is mildly lightheaded upon standing which resolves quickly -No Imdur due to previous intolerance. Patient was started on amlodipine 5 mg daily, however he does not think that this is been started and it appears that some of the medication changes were not implemented. The patient is to check his medication bottles when he gets home and call back so that we can reconcile and ensure he's taking the proper medications -Continue dual antiplatelet therapy with Plavix 75 mg daily and aspirin 81 mg daily -Pt continues to be smoke free having quit 03/24/17  -Patient advised on gentle initiation of physical activity starting with 5 minute walks daily and advised to avoid the heat with plans for patient to walk at the mall -Follow-up in one month  to assess medication tolerance and compliance  2. Ischemic cardiomyopathy -LVEF estimated to be approximately 35% at time of cardiac catheterization with mildly elevated LVEDP -No dyspnea on exertion, orthopnea, PND, or peripheral edema -Continue beta blocker, ARB (it appears the patient has been taking both lisinopril and losartan. Patient is to stop lisinopril and continue losartan-HCTZ)  3. Hypertension -Blood pressures well controlled. Patient apparently has been taking lisinopril and losartan-hydrochlorothiazide. He is instructed to stop lisinopril. -Patient has mild lightheadedness upon standing. Safety measures reviewed with patient. Hopefully this will improve once lisinopril was stopped.  4. Hyperlipidemia -Lipid profile in 06/03/2017 showed LDL of 57 which is at goal of less than 70 -Triglycerides 195, advised on limitation of concentrated sweets and processed foods -Continue atorvastatin 80 mg  -FLP/LFTs in 6 weeks for monitoring statin- will check at next appt in 1 month  5. Diabetes type 2 -A1c on 05/06/17 was 7.8 -As above discussed dietary restriction of concentrated sweets and processed foods.  Addendum:  The patient called back to report that he was actually taking all of the medications properly as per the hospital AVS except that he had not increased the carvedilol. Prescription for new carvedilol dose of 12.5 mg bid sent in to pharmacy.   Medication Adjustments/Labs and Tests Ordered: Current medicines are reviewed at length with the patient today.  Concerns regarding medicines are outlined above. Labs and tests ordered and medication changes are outlined in the patient instructions below:  Patient Instructions  Medication Instructions:  Your physician has recommended you make the following change in your medication:  1. Amlodipine ( 5 mg ) daily was sent in today, Go by list given today. 2. Lisinopril was discontinue at hospital.     Labwork: -None  Testing/Procedures: -None  Follow-Up: Your physician recommends that you keep your scheduled  follow-up appointment with Dr. [name]. Bring all your medications to your next visit.    Any Other Special Instructions Will Be Listed Below (If Applicable).  [facility] to be picked up. Go home and call us at [phone] after you check list of medications.  Safety- Rise slowly from seated position.   If you need a refill on your cardiac medications before your next appointment, please call your pharmacy.  Signed, Rodney Bon, NP  06/17/2017 11:13 AM    [facility]

## 2017-06-17 NOTE — Telephone Encounter (Signed)
Pt called in after appointment this am.  Rodney Gregory through all medications. Pt did not have coreg (12.5 mg ) bid, sent in to pharmacy.  Reiterated how important it was to bring all medications to Dr.Smith visit. Will send to [name], NP, to [facility].

## 2017-06-24 ENCOUNTER — Other Ambulatory Visit: Payer: Self-pay | Admitting: Physician Assistant

## 2017-06-24 DIAGNOSIS — I10 Essential (primary) hypertension: Secondary | ICD-10-CM

## 2017-06-25 ENCOUNTER — Encounter: Payer: Self-pay | Admitting: *Deleted

## 2017-07-09 NOTE — Progress Notes (Addendum)
Cardiology Office Note    Date:  07/10/2017   ID:  Rodney Gregory, DOB 05-11-1961, MRN [id number]  PCP:  [name], MD  Cardiologist: [name], MD   Chief Complaint  Patient presents with  . Coronary Artery Disease    History of Present Illness:  Rodney Gregory is a 56 y.o. male for follow up CAD with bypass including LIMA to LAD, SVG to circumflex, and SVG to diagonal 2011, hyperlipidemia, hypertension, and diabetes mellitus.Non-ST elevation myocardial infarction June 2018 due to occlusion of the native diagonal of the LAD.  He had an inferolateral infarction due to occlusion of the SVG to the obtuse marginal. On presentation he had had onset of discomfort 36 hours prior to arrival. No intervention was performed. Cath also demonstrated that SVG to the diagonal was occluded. LIMA to LAD was patent. Other vessels were patent. Obtuse marginal which had been bypassed contained 90% stenosis but the vessel caliber was small.  Since discharge from the hospital he has had no recurrence of discomfort. He knows that the discomfort was dissimilar to his first myocardial infarction which was in the LAD distribution. We spent time discussing the fact that each vessel territory can have a different quality of discomfort and intensity can be different as well. Using courtesy call if any recurrence of chest discomfort of any variety.  He is concerned that the acute LVEF was 35%.  Past Medical History:  Diagnosis Date  . Anemia    hx of blood loss anemia  . Coronary artery disease   . Diabetes mellitus 12/18/2011  . Hyperlipidemia   . Hypertension   . Myocardial infarction [facility])     Past Surgical History:  Procedure Laterality Date  . BACK SURGERY    . CORONARY ARTERY BYPASS GRAFT  02/2010  . LEFT HEART CATH AND CORS/GRAFTS ANGIOGRAPHY N/A 06/03/2017   Procedure: Left Heart Cath and Cors/Grafts Angiography;  Surgeon: [name], MD;  Location: [facility] INVASIVE CV LAB;  Service:  Cardiovascular;  Laterality: N/A;  . ROTATOR CUFF REPAIR  2009   right    Current Medications: Outpatient Medications Prior to Visit  Medication Sig Dispense Refill  . amLODipine (NORVASC) 5 MG tablet Take 1 tablet (5 mg total) by mouth daily. 90 tablet 2  . aspirin EC 81 MG tablet Take 1 tablet (81 mg total) by mouth daily. DO NOT START UNTIL FINISHED WITH 325 MG ASPIRIN    . atorvastatin (LIPITOR) 80 MG tablet Take 1 tablet (80 mg total) by mouth daily. 30 tablet 3  . carvedilol (COREG) 12.5 MG tablet Take 1 tablet (12.5 mg total) by mouth 2 (two) times daily with a meal. 180 tablet 2  . clopidogrel (PLAVIX) 75 MG tablet Take 1 tablet (75 mg total) by mouth daily with breakfast. 30 tablet 6  . nitroGLYCERIN (NITROSTAT) 0.4 MG SL tablet Place 1 tablet (0.4 mg total) under the tongue every 5 (five) minutes as needed for chest pain. 25 tablet 3  . losartan-hydrochlorothiazide (HYZAAR) 50-12.5 MG tablet Take 1 tablet by mouth daily. 90 tablet 3  . carvedilol (COREG) 6.25 MG tablet TAKE 1.5 TABLETS BY MOUTH 2 TIMES DAILY WITH A MEAL. 270 tablet 3  . glimepiride (AMARYL) 2 MG tablet Take 2 mg by mouth daily.  5   No facility-administered medications prior to visit.      Allergies:   Ace inhibitors; Imdur [isosorbide mononitrate]; and Isosorbide nitrate   Social History   Social History  .  Marital status: Married    Spouse name: N/A  . Number of children: N/A  . Years of education: N/A   Social History Main Topics  . Smoking status: Former Smoker    Types: Cigarettes    Quit date: 03/24/2017  . Smokeless tobacco: Never Used  . Alcohol use No  . Drug use: No  . Sexual activity: Yes   Other Topics Concern  . None   Social History Narrative  . None     Family History:  The patient's family history includes Diabetes in his mother; Heart attack in his mother; Hypertension in his father and mother.   ROS:   Please see the history of present illness.    Anxiety, low back  discomfort, dizziness, some difficulty with balance.  All other systems reviewed and are negative.   PHYSICAL EXAM:   VS:  BP 122/80 (BP Location: Right Arm)   Pulse 66   Ht 5\' 8"  (1.727 m)   Wt 208 lb 6.4 oz (94.5 kg)   BMI 31.69 kg/m    GEN: Well nourished, well developed, in no acute distress . Moderately overweight. HEENT: normal  Neck: no JVD, carotid bruits, or masses Cardiac: RRR; no murmurs, rubs, or gallops,no edema  Respiratory:  clear to auscultation bilaterally, normal work of breathing GI: soft, nontender, nondistended, + BS MS: no deformity or atrophy  Skin: warm and dry, no rash Neuro:  Alert and Oriented x 3, Strength and sensation are intact Psych: euthymic mood, full affect  Wt Readings from Last 3 Encounters:  07/10/17 208 lb 6.4 oz (94.5 kg)  06/17/17 204 lb 3.2 oz (92.6 kg)  06/04/17 204 lb 4.8 oz (92.7 kg)      Studies/Labs Reviewed:   EKG:  EKG  Not repeated  Recent Labs: 06/03/2017: BUN 12; Creatinine, Ser 1.07; Potassium 3.7; Sodium 140 06/04/2017: Hemoglobin 13.0; Platelets 134   Lipid Panel    Component Value Date/Time   CHOL 119 06/03/2017 0110   TRIG 195 (H) 06/03/2017 0110   HDL 23 (L) 06/03/2017 0110   CHOLHDL 5.2 06/03/2017 0110   VLDL 39 06/03/2017 0110   LDLCALC 57 06/03/2017 0110    Additional studies/ records that were reviewed today include:  Cardiac catheterization 06/03/17: Coronary Diagrams   Diagnostic Diagram        ACUTE LVEF 35%.   ASSESSMENT:    1. Coronary artery disease involving coronary bypass graft of native heart with angina pectoris (HCC)   2. Old MI (myocardial infarction)   3. NSTEMI (non-ST elevated myocardial infarction) (HCC)   4. Hyperlipidemia, unspecified hyperlipidemia type   5. Acute on chronic combined systolic and diastolic CHF (congestive heart failure) (HCC)      PLAN:  In order of problems listed above:  1. He is stable without recurrent angina. I believe he had a completed lateral  wall infarction when thing graft occlusion occurred. 2. Acute LVEF was 35% after recent non-ST elevation MI.. Increase Losartan HCT to 100 mg/12.5 mg  daily. Further optimize beta blocker therapy if blood pressure tolerates. Basic metabolic panel in 10-14 days. Echocardiogram in 3 months. Follow-up cardiology/Marquisha Nikolov 3. See above. 4. LDL target less than 70. 5. No evidence of volume overload and denies dyspnea. Will reassess LV function after optimization of heart failure therapy as noted above. This will be done in 3 months.  Call if dyspnea. Increase aerobic activity. Increase losartan HCT to 100/12.5. Blood work in 10-14 days. Echocardiogram in 3 months.  Medication Adjustments/Labs and  Tests Ordered: Current medicines are reviewed at length with the patient today.  Concerns regarding medicines are outlined above.  Medication changes, Labs and Tests ordered today are listed in the Patient Instructions below. Patient Instructions  Medication Instructions:  1) INCREASE Losartan/HCTZ to 100/12.5mg  once daily.  Labwork: Your physician recommends that you return for lab work in: 10-14 days (BMET)   Testing/Procedures: Your physician has requested that you have an echocardiogram in 3 months. Echocardiography is a painless test that uses sound waves to create images of your heart. It provides your doctor with information about the size and shape of your heart and how well your heart's chambers and valves are working. This procedure takes approximately one hour. There are no restrictions for this procedure.    Follow-Up: Your physician recommends that you schedule a follow-up appointment in: 4 months with Dr. [name].    Any Other Special Instructions Will Be Listed Below (If Applicable).     If you need a refill on your cardiac medications before your next appointment, please call your pharmacy.      Signed, [name], MD  07/10/2017 1:06 PM    [facility]  [facility]Care [address] [place], [place], [place]  [address] Phone: [phone]; Fax: [phone]

## 2017-07-10 ENCOUNTER — Encounter (INDEPENDENT_AMBULATORY_CARE_PROVIDER_SITE_OTHER): Payer: Self-pay

## 2017-07-10 ENCOUNTER — Ambulatory Visit (INDEPENDENT_AMBULATORY_CARE_PROVIDER_SITE_OTHER): Payer: Managed Care, Other (non HMO) | Admitting: Interventional Cardiology

## 2017-07-10 ENCOUNTER — Encounter: Payer: Self-pay | Admitting: Interventional Cardiology

## 2017-07-10 VITALS — BP 122/80 | HR 66 | Ht 68.0 in | Wt 208.4 lb

## 2017-07-10 DIAGNOSIS — I5043 Acute on chronic combined systolic (congestive) and diastolic (congestive) heart failure: Secondary | ICD-10-CM | POA: Diagnosis not present

## 2017-07-10 DIAGNOSIS — I214 Non-ST elevation (NSTEMI) myocardial infarction: Secondary | ICD-10-CM

## 2017-07-10 DIAGNOSIS — I252 Old myocardial infarction: Secondary | ICD-10-CM

## 2017-07-10 DIAGNOSIS — I25709 Atherosclerosis of coronary artery bypass graft(s), unspecified, with unspecified angina pectoris: Secondary | ICD-10-CM

## 2017-07-10 DIAGNOSIS — E785 Hyperlipidemia, unspecified: Secondary | ICD-10-CM

## 2017-07-10 MED ORDER — LOSARTAN POTASSIUM-HCTZ 100-12.5 MG PO TABS: 1.0000 | ORAL_TABLET | Freq: Every day | ORAL | 3 refills | Status: DC

## 2017-07-10 MED ORDER — LOSARTAN POTASSIUM-HCTZ 100-25 MG PO TABS: 1.0000 | ORAL_TABLET | Freq: Every day | ORAL | 3 refills | Status: DC

## 2017-07-10 NOTE — Patient Instructions (Signed)
Medication Instructions:  1) INCREASE Losartan/HCTZ to 100/12.5mg  once daily.  Labwork: Your physician recommends that you return for lab work in: 10-14 days (BMET)   Testing/Procedures: Your physician has requested that you have an echocardiogram in 3 months. Echocardiography is a painless test that uses sound waves to create images of your heart. It provides your doctor with information about the size and shape of your heart and how well your heart's chambers and valves are working. This procedure takes approximately one hour. There are no restrictions for this procedure.    Follow-Up: Your physician recommends that you schedule a follow-up appointment in: 4 months with Dr. [name].    Any Other Special Instructions Will Be Listed Below (If Applicable).     If you need a refill on your cardiac medications before your next appointment, please call your pharmacy.

## 2017-07-22 ENCOUNTER — Other Ambulatory Visit: Payer: Managed Care, Other (non HMO)

## 2017-07-30 ENCOUNTER — Other Ambulatory Visit: Payer: 59 | Admitting: *Deleted

## 2017-07-30 DIAGNOSIS — I10 Essential (primary) hypertension: Secondary | ICD-10-CM

## 2017-07-30 LAB — BASIC METABOLIC PANEL
BUN/Creatinine Ratio: 18 (ref 9–20)
BUN: 23 mg/dL (ref 6–24)
CALCIUM: 9.2 mg/dL (ref 8.7–10.2)
CO2: 19 mmol/L — AB (ref 20–29)
Chloride: 107 mmol/L — ABNORMAL HIGH (ref 96–106)
Creatinine, Ser: 1.31 mg/dL — ABNORMAL HIGH (ref 0.76–1.27)
GFR calc Af Amer: 70 mL/min/{1.73_m2} (ref >59)
GFR, EST NON AFRICAN AMERICAN: 61 mL/min/{1.73_m2} (ref >59)
Glucose: 152 mg/dL — ABNORMAL HIGH (ref 65–99)
Potassium: 4 mmol/L (ref 3.5–5.2)
Sodium: 140 mmol/L (ref 134–144)

## 2017-07-31 ENCOUNTER — Telehealth: Payer: Self-pay | Admitting: *Deleted

## 2017-07-31 DIAGNOSIS — I10 Essential (primary) hypertension: Secondary | ICD-10-CM

## 2017-07-31 NOTE — Telephone Encounter (Signed)
Spoke with pt and made him aware of lab results and recommendations per Dr. [name].  Pt will come for labs on 09/02/17.  Pt appreciative for call.

## 2017-07-31 NOTE — Telephone Encounter (Signed)
-----   Message from [name], MD sent at 07/30/2017  8:47 PM EDT ----- Let the patient know the kidney function and potassium or stable however he should have a repeat basic metabolic panel in 1 month A copy will be sent to [name], MD

## 2017-08-19 ENCOUNTER — Other Ambulatory Visit: Payer: Self-pay | Admitting: Physician Assistant

## 2017-09-02 ENCOUNTER — Telehealth: Payer: Self-pay | Admitting: Interventional Cardiology

## 2017-09-02 ENCOUNTER — Other Ambulatory Visit: Payer: 59

## 2017-09-02 NOTE — Telephone Encounter (Signed)
New Message     Pt states he has a sore throat, is there something that he can take that will not effect his heart medication?

## 2017-09-02 NOTE — Telephone Encounter (Signed)
Advised pt ok to use Chloraseptic spray or lozenges.  Pt then asked about cold medications because he thinks he may be developing a cold.  Advised on plain Mucinex and Coricidin HBP.  Encouraged pt to speak with pharmacist when there is any concerns on which medications to get.  Pt verbalized understanding and was appreciative for call.

## 2017-09-09 ENCOUNTER — Other Ambulatory Visit: Payer: 59

## 2017-09-12 ENCOUNTER — Other Ambulatory Visit: Payer: 59 | Admitting: *Deleted

## 2017-09-12 DIAGNOSIS — I10 Essential (primary) hypertension: Secondary | ICD-10-CM

## 2017-09-12 LAB — BASIC METABOLIC PANEL
BUN / CREAT RATIO: 15 (ref 9–20)
BUN: 23 mg/dL (ref 6–24)
CHLORIDE: 103 mmol/L (ref 96–106)
CO2: 21 mmol/L (ref 20–29)
Calcium: 9.2 mg/dL (ref 8.7–10.2)
Creatinine, Ser: 1.53 mg/dL — ABNORMAL HIGH (ref 0.76–1.27)
GFR calc non Af Amer: 50 mL/min/{1.73_m2} — ABNORMAL LOW (ref >59)
GFR, EST AFRICAN AMERICAN: 58 mL/min/{1.73_m2} — AB (ref >59)
GLUCOSE: 151 mg/dL — AB (ref 65–99)
Potassium: 4.1 mmol/L (ref 3.5–5.2)
SODIUM: 138 mmol/L (ref 134–144)

## 2017-10-03 ENCOUNTER — Ambulatory Visit (HOSPITAL_COMMUNITY): Payer: 59 | Attending: Cardiovascular Disease

## 2017-10-03 ENCOUNTER — Other Ambulatory Visit: Payer: Self-pay

## 2017-10-03 DIAGNOSIS — D649 Anemia, unspecified: Secondary | ICD-10-CM | POA: Insufficient documentation

## 2017-10-03 DIAGNOSIS — E785 Hyperlipidemia, unspecified: Secondary | ICD-10-CM | POA: Insufficient documentation

## 2017-10-03 DIAGNOSIS — I2510 Atherosclerotic heart disease of native coronary artery without angina pectoris: Secondary | ICD-10-CM | POA: Diagnosis not present

## 2017-10-03 DIAGNOSIS — I219 Acute myocardial infarction, unspecified: Secondary | ICD-10-CM | POA: Diagnosis not present

## 2017-10-03 DIAGNOSIS — I110 Hypertensive heart disease with heart failure: Secondary | ICD-10-CM | POA: Insufficient documentation

## 2017-10-03 DIAGNOSIS — I5043 Acute on chronic combined systolic (congestive) and diastolic (congestive) heart failure: Secondary | ICD-10-CM

## 2017-11-10 NOTE — Progress Notes (Signed)
Cardiology Office Note    Date:  11/11/2017   ID:  Rodney Gregory, DOB 1961-06-09, MRN [id number]  PCP:  [name], MD  Cardiologist: [name], MD   Chief Complaint  Patient presents with  . Coronary Artery Disease  . Congestive Heart Failure    History of Present Illness:  Rodney Gregory is a 56 y.o. male for follow up CAD with bypass including LIMA to LAD, SVG to circumflex, and SVG to diagonal 2011, hyperlipidemia, hypertension, and diabetes mellitus.Non-ST elevation myocardial infarction June 2018 due to occlusion of the native diagonal of the LAD.   Print has experienced some dizziness since his myocardial infarction.  Much of it is related to positional change as in standing from sitting.  There is no vertiginous quality.  He denies nausea.  No palpitations.  He denies chest pain, exertional dyspnea, orthopnea, and PND.  Nitroglycerin has not been required.  There is no peripheral edema and no obvious medication side effects.   Past Medical History:  Diagnosis Date  . Anemia    hx of blood loss anemia  . Coronary artery disease   . Diabetes mellitus 12/18/2011  . Hyperlipidemia   . Hypertension   . Myocardial infarction [facility])     Past Surgical History:  Procedure Laterality Date  . BACK SURGERY    . CORONARY ARTERY BYPASS GRAFT  02/2010  . Left Heart Cath and Cors/Grafts Angiography N/A 06/03/2017   Performed by [name], MD at [facility] INVASIVE CV LAB  . ROTATOR CUFF REPAIR  2009   right    Current Medications: Outpatient Medications Prior to Visit  Medication Sig Dispense Refill  . amLODipine (NORVASC) 5 MG tablet Take 1 tablet (5 mg total) by mouth daily. 90 tablet 2  . aspirin EC 81 MG tablet Take 1 tablet (81 mg total) by mouth daily. DO NOT START UNTIL FINISHED WITH 325 MG ASPIRIN    . atorvastatin (LIPITOR) 80 MG tablet Take 1 tablet (80 mg total) by mouth daily. 30 tablet 3  . carvedilol (COREG) 12.5 MG tablet Take 1 tablet (12.5 mg total)  by mouth 2 (two) times daily with a meal. 180 tablet 2  . clopidogrel (PLAVIX) 75 MG tablet Take 1 tablet (75 mg total) by mouth daily with breakfast. 30 tablet 6  . glimepiride (AMARYL) 4 MG tablet Take 6 mg by mouth daily with breakfast.    . losartan-hydrochlorothiazide (HYZAAR) 100-12.5 MG tablet Take 1 tablet by mouth daily. 90 tablet 3  . nitroGLYCERIN (NITROSTAT) 0.4 MG SL tablet Place 1 tablet (0.4 mg total) under the tongue every 5 (five) minutes as needed for chest pain. 25 tablet 3   No facility-administered medications prior to visit.      Allergies:   Ace inhibitors; Imdur [isosorbide mononitrate]; and Isosorbide nitrate   Social History   Socioeconomic History  . Marital status: Married    Spouse name: None  . Number of children: None  . Years of education: None  . Highest education level: None  Social Needs  . Financial resource strain: None  . Food insecurity - worry: None  . Food insecurity - inability: None  . Transportation needs - medical: None  . Transportation needs - non-medical: None  Occupational History  . None  Tobacco Use  . Smoking status: Former Smoker    Types: Cigarettes    Last attempt to quit: 03/24/2017    Years since quitting: 0.6  . Smokeless tobacco:  Never Used  Substance and Sexual Activity  . Alcohol use: No  . Drug use: No  . Sexual activity: Yes  Other Topics Concern  . None  Social History Narrative  . None     Family History:  The patient's family history includes Diabetes in his mother; Heart attack in his mother; Hypertension in his father and mother.   ROS:   Please see the history of present illness.    Dizziness, difficulty with balance, otherwise unremarkable. All other systems reviewed and are negative.   PHYSICAL EXAM:   VS:  BP 112/74   Pulse 66   Ht 5' 8.5" (1.74 m)   Wt 213 lb 6.4 oz (96.8 kg)   BMI 31.98 kg/m    GEN: Well nourished, well developed, in no acute distress  HEENT: normal  Neck: no JVD,  carotid bruits, or masses Cardiac: RRR; no murmurs, rubs, or gallops,no edema  Respiratory:  clear to auscultation bilaterally, normal work of breathing GI: soft, nontender, nondistended, + BS MS: no deformity or atrophy  Skin: warm and dry, no rash Neuro:  Alert and Oriented x 3, Strength and sensation are intact Psych: euthymic mood, full affect  Wt Readings from Last 3 Encounters:  11/11/17 213 lb 6.4 oz (96.8 kg)  07/10/17 208 lb 6.4 oz (94.5 kg)  06/17/17 204 lb 3.2 oz (92.6 kg)       Studies/Labs Reviewed:   EKG:  EKG  Not repeated.  Recent Labs: 06/04/2017: Hemoglobin 13.0; Platelets 134 09/12/2017: BUN 23; Creatinine, Ser 1.53; Potassium 4.1; Sodium 138   Lipid Panel    Component Value Date/Time   CHOL 119 06/03/2017 0110   TRIG 195 (H) 06/03/2017 0110   HDL 23 (L) 06/03/2017 0110   CHOLHDL 5.2 06/03/2017 0110   VLDL 39 06/03/2017 0110   LDLCALC 57 06/03/2017 0110    Additional studies/ records that were reviewed today include:  2D Doppler echocardiogram October 2018: ------------------------------------------------------------------- Study Conclusions   - Left ventricle: The cavity size was normal. There was moderate   concentric hypertrophy. Systolic function was normal. The   estimated ejection fraction was in the range of 55% to 60%.   Hypokinesis of the midanteroseptal and anterior myocardium.   Features are consistent with a pseudonormal left ventricular   filling pattern, with concomitant abnormal relaxation and   increased filling pressure (grade 2 diastolic dysfunction). - Left atrium: The atrium was mildly to moderately dilated.      ASSESSMENT:    1. Chronic combined systolic and diastolic heart failure (HCC)   2. Coronary artery disease involving coronary bypass graft of native heart with angina pectoris (HCC)   3. Essential hypertension   4. Hyperlipidemia, unspecified hyperlipidemia type   5. Type 2 diabetes mellitus with other circulatory  complication, without long-term current use of insulin (HCC)      PLAN:  In order of problems listed above:  1. Improved LV function to EF 50-55%.  Compliant with current medical regimen.  On moderate dose beta-blocker and ARB therapy. 2. Stable without angina. 3. Excellent blood pressure control with target 130/85 mmHg or less. 4. LDL target less than 70. 5. Hemoglobin A1c should be less than 6.5.  Most recent was 7.8 in May 2018.  Encouraged aerobic activity.  Plant-based diet.  Clinical follow-up in 6 months.    Medication Adjustments/Labs and Tests Ordered: Current medicines are reviewed at length with the patient today.  Concerns regarding medicines are outlined above.  Medication changes, Labs and  Tests ordered today are listed in the Patient Instructions below. Patient Instructions  Medication Instructions:  Your physician recommends that you continue on your current medications as directed. Please refer to the Current Medication list given to you today.  Labwork: None  Testing/Procedures: None  Follow-Up: Your physician wants you to follow-up in: 6-9 months with Dr. [name].  You will receive a reminder letter in the mail two months in advance. If you don't receive a letter, please call our office to schedule the follow-up appointment.   Any Other Special Instructions Will Be Listed Below (If Applicable).     If you need a refill on your cardiac medications before your next appointment, please call your pharmacy.      Signed, [name], MD  11/11/2017 10:48 AM    [facility] [address] [place], [place], [place]  [address] Phone: [phone]; Fax: [phone]

## 2017-11-11 ENCOUNTER — Encounter: Payer: Self-pay | Admitting: Interventional Cardiology

## 2017-11-11 ENCOUNTER — Ambulatory Visit (INDEPENDENT_AMBULATORY_CARE_PROVIDER_SITE_OTHER): Payer: 59 | Admitting: Interventional Cardiology

## 2017-11-11 VITALS — BP 112/74 | HR 66 | Ht 68.5 in | Wt 213.4 lb

## 2017-11-11 DIAGNOSIS — I5042 Chronic combined systolic (congestive) and diastolic (congestive) heart failure: Secondary | ICD-10-CM | POA: Diagnosis not present

## 2017-11-11 DIAGNOSIS — I25709 Atherosclerosis of coronary artery bypass graft(s), unspecified, with unspecified angina pectoris: Secondary | ICD-10-CM | POA: Diagnosis not present

## 2017-11-11 DIAGNOSIS — I10 Essential (primary) hypertension: Secondary | ICD-10-CM

## 2017-11-11 DIAGNOSIS — E1159 Type 2 diabetes mellitus with other circulatory complications: Secondary | ICD-10-CM | POA: Diagnosis not present

## 2017-11-11 DIAGNOSIS — E785 Hyperlipidemia, unspecified: Secondary | ICD-10-CM | POA: Diagnosis not present

## 2017-11-11 NOTE — Patient Instructions (Signed)
Medication Instructions:  Your physician recommends that you continue on your current medications as directed. Please refer to the Current Medication list given to you today.   Labwork: None  Testing/Procedures: None  Follow-Up: Your physician wants you to follow-up in: 6-9 months with Dr. Smith. You will receive a reminder letter in the mail two months in advance. If you don't receive a letter, please call our office to schedule the follow-up appointment.   Any Other Special Instructions Will Be Listed Below (If Applicable).     If you need a refill on your cardiac medications before your next appointment, please call your pharmacy.   

## 2017-11-29 ENCOUNTER — Other Ambulatory Visit: Payer: Self-pay | Admitting: Cardiology

## 2018-04-17 ENCOUNTER — Other Ambulatory Visit: Payer: Self-pay | Admitting: *Deleted

## 2018-04-17 ENCOUNTER — Telehealth: Payer: Self-pay | Admitting: Interventional Cardiology

## 2018-04-17 MED ORDER — CARVEDILOL 12.5 MG PO TABS: 12.5000 mg | ORAL_TABLET | Freq: Two times a day (BID) | ORAL | 1 refills | Status: AC

## 2018-04-17 NOTE — Telephone Encounter (Signed)
°*  STAT* If patient is at the pharmacy, call can be transferred to refill team.   1. Which medications need to be refilled? (please list name of each medication and dose if known) Carvedilol  2. Which pharmacy/location (including street and city if local pharmacy) is medication to be sent to?[facility] [phone]  3. Do they need a 30 day or 90 day supply? 180 and refills

## 2018-06-29 ENCOUNTER — Other Ambulatory Visit: Payer: Self-pay | Admitting: Interventional Cardiology

## 2018-06-30 ENCOUNTER — Encounter: Payer: Self-pay | Admitting: Interventional Cardiology

## 2018-07-13 NOTE — Progress Notes (Deleted)
Cardiology Office Note:    Date:  07/13/2018   ID:  Rodney Gregory, DOB 01/29/1961, MRN [id number]  PCP:  [name], MD  Cardiologist:  No primary care provider on file.   Referring MD: [name], MD   No chief complaint on file. ***  History of Present Illness:    Rodney Gregory is a 57 y.o. male with a hx of CAD with bypass including LIMA to LAD, SVG to circumflex, and SVG to diagonal 2011, hyperlipidemia, hypertension, and diabetes mellitus.Non-ST elevation myocardial infarction June 2018 due to occlusion of the native diagonal of the LAD.    Past Medical History:  Diagnosis Date  . Anemia    hx of blood loss anemia  . Coronary artery disease   . Diabetes mellitus 12/18/2011  . Hyperlipidemia   . Hypertension   . Myocardial infarction [facility])     Past Surgical History:  Procedure Laterality Date  . BACK SURGERY    . CORONARY ARTERY BYPASS GRAFT  02/2010  . LEFT HEART CATH AND CORS/GRAFTS ANGIOGRAPHY N/A 06/03/2017   Procedure: Left Heart Cath and Cors/Grafts Angiography;  Surgeon: [name], MD;  Location: [facility] INVASIVE CV LAB;  Service: Cardiovascular;  Laterality: N/A;  . ROTATOR CUFF REPAIR  2009   right    Current Medications: No outpatient medications have been marked as taking for the 07/15/18 encounter (Appointment) with [name], MD.     Allergies:   Ace inhibitors; Imdur [isosorbide mononitrate]; and Isosorbide nitrate   Social History   Socioeconomic History  . Marital status: Married    Spouse name: Not on file  . Number of children: Not on file  . Years of education: Not on file  . Highest education level: Not on file  Occupational History  . Not on file  Social Needs  . Financial resource strain: Not on file  . Food insecurity:    Worry: Not on file    Inability: Not on file  . Transportation needs:    Medical: Not on file    Non-medical: Not on file  Tobacco Use  . Smoking status: Former Smoker    Types: Cigarettes   Last attempt to quit: 03/24/2017    Years since quitting: 1.3  . Smokeless tobacco: Never Used  Substance and Sexual Activity  . Alcohol use: No  . Drug use: No  . Sexual activity: Yes  Lifestyle  . Physical activity:    Days per week: Not on file    Minutes per session: Not on file  . Stress: Not on file  Relationships  . Social connections:    Talks on phone: Not on file    Gets together: Not on file    Attends religious service: Not on file    Active member of club or organization: Not on file    Attends meetings of clubs or organizations: Not on file    Relationship status: Not on file  Other Topics Concern  . Not on file  Social History Narrative  . Not on file     Family History: The patient's ***family history includes Diabetes in his mother; Heart attack in his mother; Hypertension in his father and mother.  ROS:   Please see the history of present illness.    *** All other systems reviewed and are negative.  EKGs/Labs/Other Studies Reviewed:    The following studies were reviewed today: ***  EKG:  EKG is *** ordered today.  The ekg  ordered today demonstrates ***  Recent Labs: 09/12/2017: BUN 23; Creatinine, Ser 1.53; Potassium 4.1; Sodium 138  Recent Lipid Panel    Component Value Date/Time   CHOL 119 06/03/2017 0110   TRIG 195 (H) 06/03/2017 0110   HDL 23 (L) 06/03/2017 0110   CHOLHDL 5.2 06/03/2017 0110   VLDL 39 06/03/2017 0110   LDLCALC 57 06/03/2017 0110    Physical Exam:    VS:  There were no vitals taken for this visit.    Wt Readings from Last 3 Encounters:  11/11/17 213 lb 6.4 oz (96.8 kg)  07/10/17 208 lb 6.4 oz (94.5 kg)  06/17/17 204 lb 3.2 oz (92.6 kg)     GEN: *** Well nourished, well developed in no acute distress HEENT: Normal NECK: No JVD. LYMPHATICS: No lymphadenopathy CARDIAC: ***RRR, ***murmur, ***gallop, *** edema. VASCULAR: *** pulses. *** bruits. RESPIRATORY:  Clear to auscultation without rales, wheezing or rhonchi    ABDOMEN: Soft, non-tender, non-distended, No pulsatile mass, MUSCULOSKELETAL: No deformity  SKIN: Warm and dry NEUROLOGIC:  Alert and oriented x 3 PSYCHIATRIC:  Normal affect   ASSESSMENT:    1. S/P CABG x 3   2. Coronary artery disease involving coronary bypass graft of native heart with angina pectoris (HCC)   3. Chronic combined systolic and diastolic heart failure (HCC)   4. Type 2 diabetes mellitus with other circulatory complication, without long-term current use of insulin (HCC)   5. Essential hypertension   6. Hyperlipidemia, unspecified hyperlipidemia type    PLAN:    In order of problems listed above:  1. ***   Medication Adjustments/Labs and Tests Ordered: Current medicines are reviewed at length with the patient today.  Concerns regarding medicines are outlined above.  No orders of the defined types were placed in this encounter.  No orders of the defined types were placed in this encounter.   There are no Patient Instructions on file for this visit.   Signed, [name], MD  07/13/2018 6:48 PM    [facility]

## 2018-07-15 ENCOUNTER — Ambulatory Visit: Payer: 59 | Admitting: Interventional Cardiology

## 2018-08-23 ENCOUNTER — Other Ambulatory Visit: Payer: Self-pay | Admitting: Interventional Cardiology

## 2018-09-24 ENCOUNTER — Other Ambulatory Visit: Payer: Self-pay | Admitting: Interventional Cardiology

## 2018-10-07 NOTE — Progress Notes (Signed)
Cardiology Office Note:    Date:  10/08/2018   ID:  Rodney Gregory, DOB [birth year], MRN [id number]  PCP:  [name], MD  Cardiologist:  No primary care provider on file.   Referring MD: [name], MD   Chief Complaint  Patient presents with  . Coronary Artery Disease    History of Present Illness:    Rodney Gregory is a 57 y.o. male with a hx of CAD with bypass including LIMA to LAD, SVG to circumflex, and SVG to diagonal 2011, hyperlipidemia, hypertension, and diabetes mellitus.Non-ST elevation myocardial infarction June 2018 due to occlusion of the native diagonal of the LAD.   Overall, Rodney Gregory is doing well.  We discontinued HCTZ earlier this year because of dizziness and that problem has completely resolved.  He denies chest pain.  He is remaining active.  He has not needed nitroglycerin.  He is relatively sedentary.  He is gaining weight.  Past Medical History:  Diagnosis Date  . Anemia    hx of blood loss anemia  . Coronary artery disease   . Diabetes mellitus 12/18/2011  . Hyperlipidemia   . Hypertension   . Myocardial infarction [facility])     Past Surgical History:  Procedure Laterality Date  . BACK SURGERY    . CORONARY ARTERY BYPASS GRAFT  02/2010  . LEFT HEART CATH AND CORS/GRAFTS ANGIOGRAPHY N/A 06/03/2017   Procedure: Left Heart Cath and Cors/Grafts Angiography;  Surgeon: [name], MD;  Location: [facility] INVASIVE CV LAB;  Service: Cardiovascular;  Laterality: N/A;  . ROTATOR CUFF REPAIR  2009   right    Current Medications: Current Meds  Medication Sig  . amLODipine (NORVASC) 2.5 MG tablet Take 2.5 mg by mouth daily.  Marland Kitchen aspirin EC 81 MG tablet Take 1 tablet (81 mg total) by mouth daily. DO NOT START UNTIL FINISHED WITH 325 MG ASPIRIN  . atorvastatin (LIPITOR) 80 MG tablet Take 1 tablet (80 mg total) by mouth daily.  . carvedilol (COREG) 12.5 MG tablet Take 1 tablet (12.5 mg total) by mouth 2 (two) times daily with a meal.  . clopidogrel (PLAVIX) 75 MG  tablet Take 1 tablet (75 mg total) by mouth daily with breakfast.  . glimepiride (AMARYL) 4 MG tablet Take 6 mg by mouth daily with breakfast.  . losartan (COZAAR) 100 MG tablet Take 100 mg by mouth daily. (Take with Hydrochlorothiazide 12.5 mg daily.)  . Multiple Vitamins-Minerals (MULTIVITAMIN PO) Take 1 tablet by mouth daily.  . nitroGLYCERIN (NITROSTAT) 0.4 MG SL tablet Place 1 tablet (0.4 mg total) under the tongue every 5 (five) minutes as needed for chest pain.  . [DISCONTINUED] hydrochlorothiazide (HYDRODIURIL) 12.5 MG tablet Take 12.5 mg by mouth daily. (Take with Losartan 100 mg daily.)     Allergies:   Ace inhibitors; Imdur [isosorbide mononitrate]; and Isosorbide nitrate   Social History   Socioeconomic History  . Marital status: Married    Spouse name: Not on file  . Number of children: Not on file  . Years of education: Not on file  . Highest education level: Not on file  Occupational History  . Not on file  Social Needs  . Financial resource strain: Not on file  . Food insecurity:    Worry: Not on file    Inability: Not on file  . Transportation needs:    Medical: Not on file    Non-medical: Not on file  Tobacco Use  . Smoking status: Former Smoker  Types: Cigarettes    Last attempt to quit: 03/24/2017    Years since quitting: 1.5  . Smokeless tobacco: Never Used  Substance and Sexual Activity  . Alcohol use: No  . Drug use: No  . Sexual activity: Yes  Lifestyle  . Physical activity:    Days per week: Not on file    Minutes per session: Not on file  . Stress: Not on file  Relationships  . Social connections:    Talks on phone: Not on file    Gets together: Not on file    Attends religious service: Not on file    Active member of club or organization: Not on file    Attends meetings of clubs or organizations: Not on file    Relationship status: Not on file  Other Topics Concern  . Not on file  Social History Narrative  . Not on file     Family  History: The patient's family history includes Diabetes in his mother; Heart attack in his mother; Hypertension in his father and mother.  ROS:   Please see the history of present illness.    Knee discomfort prevents aggressive physical activity.  Dizziness has improved.  All other systems reviewed and are negative.  EKGs/Labs/Other Studies Reviewed:    The following studies were reviewed today: None  EKG:  EKG is one ordered today.  The ekg ordered today demonstrates sinus bradycardia, poor R wave progression, septal infarct, no change in comparison to prior tracing.  Recent Labs: No results found for requested labs within last 8760 hours.  Recent Lipid Panel    Component Value Date/Time   CHOL 119 06/03/2017 0110   TRIG 195 (H) 06/03/2017 0110   HDL 23 (L) 06/03/2017 0110   CHOLHDL 5.2 06/03/2017 0110   VLDL 39 06/03/2017 0110   LDLCALC 57 06/03/2017 0110    Physical Exam:    VS:  BP 104/78   Pulse (!) 58   Ht 5\' 8"  (1.727 m)   Wt 211 lb 12.8 oz (96.1 kg)   BMI 32.20 kg/m     Wt Readings from Last 3 Encounters:  10/08/18 211 lb 12.8 oz (96.1 kg)  11/11/17 213 lb 6.4 oz (96.8 kg)  07/10/17 208 lb 6.4 oz (94.5 kg)     GEN:  Well nourished, well developed in no acute distress HEENT: Normal NECK: No JVD. LYMPHATICS: No lymphadenopathy CARDIAC: RRR, no murmur, no gallop, no edema. VASCULAR: 2+ bilateral radial and carotid pulses.  No bruits. RESPIRATORY:  Clear to auscultation without rales, wheezing or rhonchi  ABDOMEN: Soft, non-tender, non-distended, No pulsatile mass, MUSCULOSKELETAL: No deformity  SKIN: Warm and dry NEUROLOGIC:  Alert and oriented x 3 PSYCHIATRIC:  Normal affect   ASSESSMENT:    1. Coronary artery disease involving coronary bypass graft of native heart with angina pectoris (HCC)   2. Mixed hyperlipidemia   3. Essential hypertension   4. Chronic combined systolic and diastolic heart failure (HCC)   5. Type 2 diabetes mellitus with  complication, without long-term current use of insulin (HCC)   6. S/P CABG x 3    PLAN:    In order of problems listed above:  1. He is stable from the standpoint of coronary disease and is having no specific ischemic complaints.  Denies dyspnea and chest pain.  We discussed at length risk mitigation to prevent future events including LDL less than 70, A1c less than 7, blood pressure 130/80 mmHg, 150 minutes of moderate aerobic activity  per week, weight control. 2. LDL is less than 70. 3. As noted above, blood pressure is well controlled. 4. He could not afford Entresto.  We are taking max dose losartan and moderate dose beta-blocker. 5. A1c target is less than 7.0.  Most recent value was 10.6 in 2019.  Diabetes control is very disappointing.  We had a long discussion concerning secondary risk prevention.  The one missing element is consistent greater than 150 minutes of moderate aerobic activity per week.  This message was hammered home.  We also discussed incorporating increased quantities of plant-based nutrition in his diet.  This was an extended office visit with greater than 50% of the time spent in counseling and coordination of care.   Medication Adjustments/Labs and Tests Ordered: Current medicines are reviewed at length with the patient today.  Concerns regarding medicines are outlined above.  No orders of the defined types were placed in this encounter.  No orders of the defined types were placed in this encounter.   There are no Patient Instructions on file for this visit.   Signed, [name], MD  10/08/2018 9:43 AM    [facility]

## 2018-10-08 ENCOUNTER — Ambulatory Visit (INDEPENDENT_AMBULATORY_CARE_PROVIDER_SITE_OTHER): Payer: 59 | Admitting: Interventional Cardiology

## 2018-10-08 ENCOUNTER — Encounter: Payer: Self-pay | Admitting: Interventional Cardiology

## 2018-10-08 VITALS — BP 104/78 | HR 58 | Ht 68.0 in | Wt 211.8 lb

## 2018-10-08 DIAGNOSIS — E782 Mixed hyperlipidemia: Secondary | ICD-10-CM | POA: Diagnosis not present

## 2018-10-08 DIAGNOSIS — I10 Essential (primary) hypertension: Secondary | ICD-10-CM

## 2018-10-08 DIAGNOSIS — I25709 Atherosclerosis of coronary artery bypass graft(s), unspecified, with unspecified angina pectoris: Secondary | ICD-10-CM

## 2018-10-08 DIAGNOSIS — Z951 Presence of aortocoronary bypass graft: Secondary | ICD-10-CM

## 2018-10-08 DIAGNOSIS — I5042 Chronic combined systolic (congestive) and diastolic (congestive) heart failure: Secondary | ICD-10-CM | POA: Diagnosis not present

## 2018-10-08 DIAGNOSIS — E118 Type 2 diabetes mellitus with unspecified complications: Secondary | ICD-10-CM

## 2018-10-08 NOTE — Patient Instructions (Signed)
Your physician recommends that you continue on your current medications as directed. Please refer to the Current Medication list given to you today.  Your physician wants you to follow-up in:  9 -12 MONTHS WITH DR SMITH   You will receive a reminder letter in the mail two months in advance. If you don't receive a letter, please call our office to schedule the follow-up appointment.  

## 2018-11-23 ENCOUNTER — Other Ambulatory Visit: Payer: Self-pay | Admitting: Interventional Cardiology

## 2018-12-02 ENCOUNTER — Other Ambulatory Visit: Payer: Self-pay | Admitting: Interventional Cardiology

## 2018-12-16 ENCOUNTER — Other Ambulatory Visit: Payer: Self-pay | Admitting: Interventional Cardiology

## 2019-01-21 ENCOUNTER — Other Ambulatory Visit: Payer: Self-pay | Admitting: Interventional Cardiology

## 2019-10-02 ENCOUNTER — Other Ambulatory Visit: Payer: Self-pay | Admitting: Interventional Cardiology

## 2019-10-02 MED ORDER — LOSARTAN POTASSIUM 100 MG PO TABS: 100.0000 mg | ORAL_TABLET | Freq: Every day | ORAL | 0 refills | Status: DC

## 2019-10-11 ENCOUNTER — Other Ambulatory Visit: Payer: Self-pay | Admitting: Interventional Cardiology

## 2019-11-22 NOTE — Progress Notes (Signed)
Cardiology Office Note:    Date:  11/25/2019   ID:  Rodney PoliBrent D Rogowski, DOB 06/24/1961, MRN [id number]  PCP:  Ileana LaddWong, Francis P, MD  Cardiologist:  Lesleigh NoeHenry W Maribella Kuna III, MD   Referring MD: Ileana LaddWong, Francis P, MD   Chief Complaint  Patient presents with  . Coronary Artery Disease    History of Present Illness:    Rodney Gregory is a 58 y.o. male with a hx of CAD with bypass including LIMA to LAD, SVG to circumflex, and SVG to diagonal 2011, hyperlipidemia, hypertension, and diabetes mellitus. Non-ST elevation myocardial infarction June 2018 due to occlusion of the native diagonal of the LAD.  He is doing well.  He is exercising.  He gets greater than 30 minutes/day of aerobic activity.  He is doing heavy weight lifting which I warned against.  He is not using nitroglycerin.  He has not had chest pain.  He has had no arrhythmia.  Last hemoglobin A1c of record was greater than 10 in 2019.  He has gone to be reevaluated by Dr. Renaldo ReelHuang within the next 3 weeks.  Past Medical History:  Diagnosis Date  . Anemia    hx of blood loss anemia  . Coronary artery disease   . Diabetes mellitus 12/18/2011  . Hyperlipidemia   . Hypertension   . Myocardial infarction [facility](HCC)     Past Surgical History:  Procedure Laterality Date  . BACK SURGERY    . CORONARY ARTERY BYPASS GRAFT  02/2010  . LEFT HEART CATH AND CORS/GRAFTS ANGIOGRAPHY N/A 06/03/2017   Procedure: Left Heart Cath and Cors/Grafts Angiography;  Surgeon: Lyn RecordsSmith, Gamal Todisco W, MD;  [facility];  Service: Cardiovascular;  Laterality: N/A;  . ROTATOR CUFF REPAIR  2009   right    Current Medications: Current Meds  Medication Sig  . amLODipine (NORVASC) 2.5 MG tablet Take 2.5 mg by mouth daily.  Marland Kitchen. aspirin EC 81 MG tablet Take 1 tablet (81 mg total) by mouth daily. DO NOT START UNTIL FINISHED WITH 325 MG ASPIRIN  . atorvastatin (LIPITOR) 80 MG tablet Take 1 tablet (80 mg total) by mouth daily.  . carvedilol (COREG) 12.5 MG tablet Take 1  tablet (12.5 mg total) by mouth 2 (two) times daily with a meal.  . clopidogrel (PLAVIX) 75 MG tablet TAKE 1 TABLET (75 MG TOTAL) BY MOUTH DAILY WITH BREAKFAST.  Marland Kitchen. glimepiride (AMARYL) 4 MG tablet Take 6 mg by mouth daily with breakfast.  . losartan (COZAAR) 100 MG tablet Take 1 tablet (100 mg total) by mouth daily.  . Multiple Vitamins-Minerals (MULTIVITAMIN PO) Take 1 tablet by mouth daily.  . nitroGLYCERIN (NITROSTAT) 0.4 MG SL tablet Place 1 tablet (0.4 mg total) under the tongue every 5 (five) minutes as needed for chest pain.  . [DISCONTINUED] clopidogrel (PLAVIX) 75 MG tablet TAKE 1 TABLET (75 MG TOTAL) BY MOUTH DAILY WITH BREAKFAST.  . [DISCONTINUED] losartan (COZAAR) 100 MG tablet Take 1 tablet (100 mg total) by mouth daily. Please keep upcoming appt in December with Dr. Katrinka BlazingSmith before anymore refills. Thank you  . [DISCONTINUED] nitroGLYCERIN (NITROSTAT) 0.4 MG SL tablet Place 1 tablet (0.4 mg total) under the tongue every 5 (five) minutes as needed for chest pain. Please keep upcoming appt in December for future refills. Thank you     Allergies:   Ace inhibitors, Imdur [isosorbide mononitrate], and Isosorbide nitrate   Social History   Socioeconomic History  . Marital status: Married    Spouse name: Not  on file  . Number of children: Not on file  . Years of education: Not on file  . Highest education level: Not on file  Occupational History  . Not on file  Social Needs  . Financial resource strain: Not on file  . Food insecurity    Worry: Not on file    Inability: Not on file  . Transportation needs    Medical: Not on file    Non-medical: Not on file  Tobacco Use  . Smoking status: Former Smoker    Types: Cigarettes    Quit date: 03/24/2017    Years since quitting: 2.6  . Smokeless tobacco: Never Used  Substance and Sexual Activity  . Alcohol use: No  . Drug use: No  . Sexual activity: Yes  Lifestyle  . Physical activity    Days per week: Not on file    Minutes per  session: Not on file  . Stress: Not on file  Relationships  . Social Musician on phone: Not on file    Gets together: Not on file    Attends religious service: Not on file    Active member of club or organization: Not on file    Attends meetings of clubs or organizations: Not on file    Relationship status: Not on file  Other Topics Concern  . Not on file  Social History Narrative  . Not on file     Family History: The patient's family history includes Diabetes in his mother; Heart attack in his mother; Hypertension in his father and mother.  ROS:   Please see the history of present illness.    Denies orthopnea and PND.  No medication side effects.  Denies claudication.  Has not had neurological symptoms.  All other systems reviewed and are negative.  EKGs/Labs/Other Studies Reviewed:    The following studies were reviewed today: No recent or new cardiac evaluation by imaging modality  2D Doppler echocardiogram October 2018: Study Conclusions  - Left ventricle: The cavity size was normal. There was moderate   concentric hypertrophy. Systolic function was normal. The   estimated ejection fraction was in the range of 55% to 60%.   Hypokinesis of the midanteroseptal and anterior myocardium.   Features are consistent with a pseudonormal left ventricular   filling pattern, with concomitant abnormal relaxation and   increased filling pressure (grade 2 diastolic dysfunction). - Left atrium: The atrium was mildly to moderately dilated.   EKG:  EKG normal sinus rhythm, poor R wave progression V1 through V3.  Recent Labs: No results found for requested labs within last 8760 hours.  Recent Lipid Panel    Component Value Date/Time   CHOL 119 06/03/2017 0110   TRIG 195 (H) 06/03/2017 0110   HDL 23 (L) 06/03/2017 0110   CHOLHDL 5.2 06/03/2017 0110   VLDL 39 06/03/2017 0110   LDLCALC 57 06/03/2017 0110    Physical Exam:    VS:  BP 128/82   Pulse 70   Ht 5'  8" (1.727 m)   Wt 210 lb (95.3 kg)   SpO2 96%   BMI 31.93 kg/m     Wt Readings from Last 3 Encounters:  11/25/19 210 lb (95.3 kg)  10/08/18 211 lb 12.8 oz (96.1 kg)  11/11/17 213 lb 6.4 oz (96.8 kg)     GEN: Looks healthy and young. No acute distress HEENT: Normal NECK: No JVD. LYMPHATICS: No lymphadenopathy CARDIAC:  RRR without murmur, gallop, or edema.  VASCULAR:  Normal Pulses. No bruits. RESPIRATORY:  Clear to auscultation without rales, wheezing or rhonchi  ABDOMEN: Soft, non-tender, non-distended, No pulsatile mass, MUSCULOSKELETAL: No deformity  SKIN: Warm and dry NEUROLOGIC:  Alert and oriented x 3 PSYCHIATRIC:  Normal affect   ASSESSMENT:    1. Coronary artery disease involving coronary bypass graft of native heart with angina pectoris ([place])   2. Mixed hyperlipidemia   3. Essential hypertension   4. Chronic combined systolic and diastolic heart failure ([place])   5. Type 2 diabetes mellitus with complication, without long-term current use of insulin (HCC)   6. Educated about COVID-19 virus infection    PLAN:    In order of problems listed above:  1. Secondary prevention is discussed in detail.  He is encouraged to continue exercising.  He is at greater than 30 minutes of physical activity per day.  He is encouraged to get his hemoglobin A1c down. 2. Most recent LDL cholesterol was 58 in January 2020.  He has upcoming blood work with Dr. Ronalee Red. 3. Blood pressure is controlled.  Target 130/80 mmHg. 4. No clinical evidence of volume overload. 5. Hemoglobin A1c was extremely elevated last time checked.  It was greater than 10 in April 2019.  We discussed diet, exercise, and weight loss. 6. The 3W's is being practiced to avoid COVID-19 infection.  Overall education and awareness concerning primary/secondary risk prevention was discussed in detail: LDL less than 70, hemoglobin A1c less than 7, blood pressure target less than 130/80 mmHg, >150 minutes of moderate aerobic  activity per week, avoidance of smoking, weight control (via diet and exercise), and continued surveillance/management of/for obstructive sleep apnea.    Medication Adjustments/Labs and Tests Ordered: Current medicines are reviewed at length with the patient today.  Concerns regarding medicines are outlined above.  Orders Placed This Encounter  Procedures  . EKG 12-Lead   Meds ordered this encounter  Medications  . losartan (COZAAR) 100 MG tablet    Sig: Take 1 tablet (100 mg total) by mouth daily.    Dispense:  90 tablet    Refill:  3  . nitroGLYCERIN (NITROSTAT) 0.4 MG SL tablet    Sig: Place 1 tablet (0.4 mg total) under the tongue every 5 (five) minutes as needed for chest pain.    Dispense:  75 tablet    Refill:  0    Patient Instructions  Medication Instructions:  Your physician recommends that you continue on your current medications as directed. Please refer to the Current Medication list given to you today.  *If you need a refill on your cardiac medications before your next appointment, please call your pharmacy*  Lab Work: None If you have labs (blood work) drawn today and your tests are completely normal, you will receive your results only by: Marland Kitchen MyChart Message (if you have MyChart) OR . A paper copy in the mail If you have any lab test that is abnormal or we need to change your treatment, we will call you to review the results.  Testing/Procedures: None  Follow-Up: At [facility], you and your health needs are our priority.  As part of our continuing mission to provide you with exceptional heart care, we have created designated Provider Care Teams.  These Care Teams include your primary Cardiologist (physician) and Advanced Practice Providers (APPs -  Physician Assistants and Nurse Practitioners) who all work together to provide you with the care you need, when you need it.  Your next appointment:   12  month(s)  The format for your next appointment:   In  Person  Provider:   You may see [name], MD or one of the following Advanced Practice Providers on your designated Care Team:    Norma Fredrickson, NP  Nada Boozer, NP  Georgie Chard, NP   Other Instructions      Signed, [name], MD  11/25/2019 11:03 AM    [facility]

## 2019-11-24 ENCOUNTER — Other Ambulatory Visit: Payer: Self-pay | Admitting: Interventional Cardiology

## 2019-11-25 ENCOUNTER — Encounter (INDEPENDENT_AMBULATORY_CARE_PROVIDER_SITE_OTHER): Payer: Self-pay

## 2019-11-25 ENCOUNTER — Other Ambulatory Visit: Payer: Self-pay

## 2019-11-25 ENCOUNTER — Encounter: Payer: Self-pay | Admitting: Interventional Cardiology

## 2019-11-25 ENCOUNTER — Ambulatory Visit (INDEPENDENT_AMBULATORY_CARE_PROVIDER_SITE_OTHER): Payer: 59 | Admitting: Interventional Cardiology

## 2019-11-25 VITALS — BP 128/82 | HR 70 | Ht 68.0 in | Wt 210.0 lb

## 2019-11-25 DIAGNOSIS — E782 Mixed hyperlipidemia: Secondary | ICD-10-CM | POA: Diagnosis not present

## 2019-11-25 DIAGNOSIS — I25709 Atherosclerosis of coronary artery bypass graft(s), unspecified, with unspecified angina pectoris: Secondary | ICD-10-CM | POA: Diagnosis not present

## 2019-11-25 DIAGNOSIS — I110 Hypertensive heart disease with heart failure: Secondary | ICD-10-CM

## 2019-11-25 DIAGNOSIS — E118 Type 2 diabetes mellitus with unspecified complications: Secondary | ICD-10-CM

## 2019-11-25 DIAGNOSIS — I10 Essential (primary) hypertension: Secondary | ICD-10-CM

## 2019-11-25 DIAGNOSIS — I5042 Chronic combined systolic (congestive) and diastolic (congestive) heart failure: Secondary | ICD-10-CM | POA: Diagnosis not present

## 2019-11-25 DIAGNOSIS — Z7189 Other specified counseling: Secondary | ICD-10-CM

## 2019-11-25 MED ORDER — NITROGLYCERIN 0.4 MG SL SUBL: 0.4000 mg | SUBLINGUAL_TABLET | SUBLINGUAL | 0 refills | Status: DC | PRN

## 2019-11-25 MED ORDER — LOSARTAN POTASSIUM 100 MG PO TABS: 100.0000 mg | ORAL_TABLET | Freq: Every day | ORAL | 3 refills | Status: DC

## 2019-11-25 NOTE — Patient Instructions (Signed)

## 2020-01-20 ENCOUNTER — Ambulatory Visit: Payer: 59 | Admitting: Dietician

## 2020-02-09 ENCOUNTER — Encounter: Payer: Self-pay | Admitting: Dietician

## 2020-02-09 ENCOUNTER — Encounter: Payer: 59 | Attending: Family Medicine | Admitting: Dietician

## 2020-02-09 ENCOUNTER — Other Ambulatory Visit: Payer: Self-pay

## 2020-02-09 DIAGNOSIS — E118 Type 2 diabetes mellitus with unspecified complications: Secondary | ICD-10-CM | POA: Insufficient documentation

## 2020-02-09 NOTE — Progress Notes (Signed)
Diabetes Self-Management Education  Visit Type: First/Initial  Appt. Start Time: 9:00am  Appt. End Time: 10:05am  02/09/2020  [name], identified by name and date of birth, is a 59 y.o. male with a diagnosis of Diabetes: Type 2.   ASSESSMENT  Patient presented today with many well-informed questions regarding other potential health-related complications, diet, and glycemic control. Patient states he would like to get better control of his A1c so that he may eventually decrease/come off diabetes-related medications.   Patient states his diet currently consists mostly of [place], chicken, fish, eggs, and salads. States he avoids red meat, dairy foods, cakes/desserts, and has cut back on pasta and other carbs. Snacks on wafers, crackers, and fruit. Avoids sodas, likes juice but has cut this out. Typical meal pattern is 2-3 meals plus maybe 1-2 snacks per day.   Diabetes Self-Management Education - 02/09/20 1028      Visit Information   Visit Type  First/Initial      Initial Visit   Diabetes Type  Type 2    Are you currently following a meal plan?  No    Are you taking your medications as prescribed?  Yes    Date Diagnosed  2012      Health Coping   How would you rate your overall health?  Good      Psychosocial Assessment   Patient Belief/Attitude about Diabetes  Motivated to manage diabetes    Self-care barriers  None    Self-management support  Doctor's office;Family    Other persons present  Patient    Patient Concerns  Nutrition/Meal planning;Glycemic Control    Special Needs  None    Preferred Learning Style  No preference indicated    Learning Readiness  Ready    How often do you need to have someone help you when you read instructions, pamphlets, or other written materials from your doctor or pharmacy?  1 - Never    What is the last grade level you completed in school?  12th      Complications   Last HgB A1C per patient/outside source  13.4 %   12/15/2019   How  often do you check your blood sugar?  1-2 times/day    Fasting Blood glucose range (mg/dL)  40-981;<19;147-829    Number of hypoglycemic episodes per month  0    Have you had a dilated eye exam in the past 12 months?  Yes    Have you had a dental exam in the past 12 months?  No    Are you checking your feet?  Yes    How many days per week are you checking your feet?  2      Dietary Intake   Breakfast  2 pieces of [place] bacon + oatmeal   or 2 eggs   Snack (morning)  crackers    Lunch  salad with [place]    Snack (afternoon)  wafers    Dinner  chicken + sides    Snack (evening)  banana    Beverage(s)  water, lemon water      Exercise   Exercise Type  Light (walking / raking leaves)    How many days per week to you exercise?  4    How many minutes per day do you exercise?  45    Total minutes per week of exercise  180      Patient Education   Previous Diabetes Education  No    Disease state  Definition of diabetes, type 1 and 2, and the diagnosis of diabetes;Factors that contribute to the development of diabetes;Explored patient's options for treatment of their diabetes    Nutrition management   Role of diet in the treatment of diabetes and the relationship between the three main macronutrients and blood glucose level;Food label reading, portion sizes and measuring food.;Carbohydrate counting;Reviewed blood glucose goals for pre and post meals and how to evaluate the patients' food intake on their blood glucose level.;Meal timing in regards to the patients' current diabetes medication.;Meal options for control of blood glucose level and chronic complications.    Physical activity and exercise   Role of exercise on diabetes management, blood pressure control and cardiac health.;Helped patient identify appropriate exercises in relation to his/her diabetes, diabetes complications and other health issue.    Monitoring  Taught/evaluated SMBG meter.;Purpose and frequency of  SMBG.;Taught/discussed recording of test results and interpretation of SMBG.;Interpreting lab values - A1C, lipid, urine microalbumina.;Identified appropriate SMBG and/or A1C goals.;Daily foot exams;Yearly dilated eye exam    Acute complications  Taught treatment of hypoglycemia - the 15 rule.    Chronic complications  Assessed and discussed foot care and prevention of foot problems;Identified and discussed with patient  current chronic complications;Dental care;Retinopathy and reason for yearly dilated eye exams;Reviewed with patient heart disease, higher risk of, and prevention;Relationship between chronic complications and blood glucose control      Individualized Goals (developed by patient)   Nutrition  Follow meal plan discussed   ~3 carb servings/meal; <2 carb servings/snack; protein with all snacks/meals   Physical Activity  Exercise 3-5 times per week    Medications  take my medication as prescribed      Outcomes   Expected Outcomes  Demonstrated interest in learning. Expect positive outcomes    Future DMSE  PRN       Individualized Plan for Diabetes Self-Management Training:   Learning Objective:  Patient will have a greater understanding of diabetes self-management. Patient education plan is to attend individual and/or group sessions per assessed needs and concerns.   Plan:  Aim for ~3 servings of carbohydrates (~45 grams of carbs) per meal.   Aim for <2 servings of carbohydrates (<30 grams of carbs) per snack.   Include a source of protein with all meals and snacks.    Expected Outcomes:  Demonstrated interest in learning. Expect positive outcomes  Education material provided: ADA - How to Thrive: A Guide for Your Journey with Diabetes, A1C conversion sheet, Meal plan card, My Plate, Snack sheet and Carbohydrate counting sheet  If problems or questions, patient to contact team via:  Phone and Email  Future DSME appointment: PRN. Patient states he would like to call back  to schedule his follow up visit.

## 2020-02-09 NOTE — Patient Instructions (Addendum)
   Aim for ~3 servings of carbohydrates (~45 grams of carbs) per meal.   Aim for <2 servings of carbohydrates (<30 grams of carbs) per snack.   Include a source of protein with all meals and snacks.

## 2020-10-08 ENCOUNTER — Ambulatory Visit: Payer: 59 | Attending: Family Medicine

## 2020-10-08 DIAGNOSIS — Z23 Encounter for immunization: Secondary | ICD-10-CM

## 2020-10-08 NOTE — Progress Notes (Signed)
   Covid-19 Vaccination Clinic  Name:  Rodney Gregory    MRN: [id number] DOB: 03-24-1961  10/08/2020  Rodney Gregory was observed post Covid-19 immunization for 15 minutes without incident. He was provided with Vaccine Information Sheet and instruction to access the V-Safe system.   Rodney Gregory was instructed to call 911 with any severe reactions post vaccine: Marland Kitchen Difficulty breathing  . Swelling of face and throat  . A fast heartbeat  . A bad rash all over body  . Dizziness and weakness

## 2020-11-01 ENCOUNTER — Other Ambulatory Visit: Payer: Self-pay | Admitting: Interventional Cardiology

## 2020-12-02 ENCOUNTER — Other Ambulatory Visit: Payer: Self-pay | Admitting: Interventional Cardiology

## 2020-12-03 ENCOUNTER — Emergency Department (HOSPITAL_COMMUNITY)
Admission: EM | Admit: 2020-12-03 | Discharge: 2020-12-04 | Disposition: A | Discharge status: Home / Self Care | Payer: 59 | Attending: Emergency Medicine | Admitting: Emergency Medicine

## 2020-12-03 ENCOUNTER — Other Ambulatory Visit: Payer: Self-pay

## 2020-12-03 ENCOUNTER — Encounter (HOSPITAL_COMMUNITY): Payer: Self-pay

## 2020-12-03 DIAGNOSIS — Z7982 Long term (current) use of aspirin: Secondary | ICD-10-CM | POA: Diagnosis not present

## 2020-12-03 DIAGNOSIS — M545 Low back pain, unspecified: Secondary | ICD-10-CM | POA: Insufficient documentation

## 2020-12-03 DIAGNOSIS — Z87891 Personal history of nicotine dependence: Secondary | ICD-10-CM | POA: Diagnosis not present

## 2020-12-03 DIAGNOSIS — Y92410 Unspecified street and highway as the place of occurrence of the external cause: Secondary | ICD-10-CM | POA: Diagnosis not present

## 2020-12-03 DIAGNOSIS — I25110 Atherosclerotic heart disease of native coronary artery with unstable angina pectoris: Secondary | ICD-10-CM | POA: Diagnosis not present

## 2020-12-03 DIAGNOSIS — I110 Hypertensive heart disease with heart failure: Secondary | ICD-10-CM | POA: Insufficient documentation

## 2020-12-03 DIAGNOSIS — M542 Cervicalgia: Secondary | ICD-10-CM | POA: Insufficient documentation

## 2020-12-03 DIAGNOSIS — I5042 Chronic combined systolic (congestive) and diastolic (congestive) heart failure: Secondary | ICD-10-CM | POA: Insufficient documentation

## 2020-12-03 DIAGNOSIS — E1140 Type 2 diabetes mellitus with diabetic neuropathy, unspecified: Secondary | ICD-10-CM | POA: Diagnosis not present

## 2020-12-03 DIAGNOSIS — E119 Type 2 diabetes mellitus without complications: Secondary | ICD-10-CM | POA: Diagnosis not present

## 2020-12-03 DIAGNOSIS — Z79899 Other long term (current) drug therapy: Secondary | ICD-10-CM | POA: Diagnosis not present

## 2020-12-03 DIAGNOSIS — Z955 Presence of coronary angioplasty implant and graft: Secondary | ICD-10-CM | POA: Diagnosis not present
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
## 2020-12-04 DIAGNOSIS — M542 Cervicalgia: Secondary | ICD-10-CM | POA: Diagnosis not present

## 2020-12-04 MED ORDER — CYCLOBENZAPRINE HCL 10 MG PO TABS: 10.0000 mg | ORAL_TABLET | Freq: Three times a day (TID) | ORAL | 0 refills | Status: DC | PRN

## 2020-12-04 MED ORDER — IBUPROFEN 800 MG PO TABS: 800.0000 mg | ORAL_TABLET | Freq: Three times a day (TID) | ORAL | 0 refills | Status: DC

## 2020-12-04 MED ORDER — CYCLOBENZAPRINE HCL 10 MG PO TABS
10.0000 mg | ORAL_TABLET | Freq: Once | ORAL | Status: AC
  Administered 2020-12-04: 10 mg via ORAL
  Filled 2020-12-04: qty 1
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
## 2021-01-18 ENCOUNTER — Other Ambulatory Visit: Payer: Self-pay | Admitting: Interventional Cardiology

## 2021-02-03 ENCOUNTER — Other Ambulatory Visit: Payer: Self-pay | Admitting: Interventional Cardiology

## 2021-02-22 ENCOUNTER — Other Ambulatory Visit: Payer: Self-pay | Admitting: Interventional Cardiology

## 2021-03-16 ENCOUNTER — Other Ambulatory Visit: Payer: Self-pay | Admitting: Interventional Cardiology

## 2021-03-21 ENCOUNTER — Telehealth: Payer: Self-pay | Admitting: Interventional Cardiology

## 2021-03-21 MED ORDER — CLOPIDOGREL BISULFATE 75 MG PO TABS: ORAL_TABLET | ORAL | 1 refills | Status: DC

## 2021-03-21 NOTE — Telephone Encounter (Signed)
*  STAT* If patient is at the pharmacy, call can be transferred to refill team.   1. Which medications need to be refilled? (please list name of each medication and dose if known) Clopidogrel  2. Which pharmacy/location (including street and city if local pharmacy) is medication to be sent to? [facility] [address] [place]  3. Do they need a 30 day or 90 day supply? Need enough until his appt on 51-0-22

## 2021-03-21 NOTE — Telephone Encounter (Signed)
Pt's medication was sent to pt's pharmacy as requested. Confirmation received.  °

## 2021-03-27 ENCOUNTER — Other Ambulatory Visit: Payer: Self-pay | Admitting: Interventional Cardiology

## 2021-03-27 DIAGNOSIS — I10 Essential (primary) hypertension: Secondary | ICD-10-CM

## 2021-04-14 ENCOUNTER — Other Ambulatory Visit: Payer: Self-pay | Admitting: Interventional Cardiology

## 2021-04-15 ENCOUNTER — Other Ambulatory Visit: Payer: Self-pay | Admitting: Interventional Cardiology

## 2021-04-17 ENCOUNTER — Other Ambulatory Visit: Payer: Self-pay | Admitting: Interventional Cardiology

## 2021-04-25 ENCOUNTER — Telehealth: Payer: Self-pay | Admitting: Interventional Cardiology

## 2021-04-25 ENCOUNTER — Other Ambulatory Visit: Payer: Self-pay

## 2021-04-25 MED ORDER — CLOPIDOGREL BISULFATE 75 MG PO TABS: ORAL_TABLET | ORAL | 0 refills | Status: DC

## 2021-04-25 NOTE — Telephone Encounter (Signed)
*  STAT* If patient is at the pharmacy, call can be transferred to refill team.   1. Which medications need to be refilled? (please list name of each medication and dose if known) clopidogrel (PLAVIX) 75 MG tablet  2. Which pharmacy/location (including street and city if local pharmacy) is medication to be sent to? [id number] [place] [place] - [address]  3. Do they need a 30 day or 90 day supply? 90

## 2021-04-26 NOTE — Telephone Encounter (Signed)
Called the pt and informed him that his medication was sent to his pharmacy for a 30 day supply, because pt has not been seen since 2020 and that he needed to see the doctor first, before a 90 day supply of the Plavix. Pt stated that his insurance will only pay for a 90 day supply. I called the pharmacy and the pharmacy stated that they are using a discount card for the plavix and that it will cost the pt 49 dollars for the medication. The pharmacy stated that they have spoken with the pt.

## 2021-04-26 NOTE — Telephone Encounter (Signed)
Follow Up:     Pt called and said pharmacist said 30 days supply iave been called in for his Clopidogrel. Pt needs 90 days, so his insurance will pay. He said been out of his medicine for 3 days.Marland Kitchen

## 2021-05-01 ENCOUNTER — Encounter: Payer: Self-pay | Admitting: Physician Assistant

## 2021-05-01 NOTE — Progress Notes (Addendum)
Cardiology Office Note    Date:  05/02/2021   ID:  Rodney Gregory, DOB 1961-11-27, MRN [id number]  PCP:  [name], MD  Cardiologist:  [name], MD  Electrophysiologist:  None   Chief Complaint: f/u CAD  History of Present Illness:   Rodney Gregory is a 60 y.o. male with history of CAD s/p CABG (LIMA to LAD, SVG to circumflex, and SVG to diagonal 2011), NSTEMI 05/2017 due to occlusion of native diagonal (managed medically), ischemic cardiomyopathy (EF 35% by cath 05/2017, 55-60% by echo 09/2017), DM, HTN, HLD, CKD stage IIIa with diabetic nephropathy who presents for cardiac follow-up. He could not previously afford Entresto so has been on ARB.   He is seen back for follow-up doing well from cardiac standpoint. Remains very active participating in the lives of his children, planning to get back to the gym. He recalled that Dr. [name] told him to avoid weight lifting and focus more on cardio. No CP, SOB, edema, palpitations, syncope or dizziness. Now on Jardiance for DM as he sees Dr. [name] with endocrinology.  Labwork independently reviewed: KPN 08/2020 LDL 65 11/2019 scan Cr 1.4, LFTs ok, A1c 13.4 12/2018 LDL 58 2018 Hgb wnl, plt 134  Past Medical History:  Diagnosis Date  . Anemia    hx of blood loss anemia  . CKD (chronic kidney disease), stage III (HCC)   . Coronary artery disease     s/p CABG (LIMA to LAD, SVG to circumflex, and SVG to diagonal 2011), NSTEMI 05/2017 due to occlusion of native diagonal (managed medically)  . Diabetes mellitus 12/18/2011   type 2  . Diabetic nephropathy (HCC)   . Hyperlipidemia   . Hypertension   . Ischemic cardiomyopathy   . Myocardial infarction [facility])     Past Surgical History:  Procedure Laterality Date  . BACK SURGERY    . CORONARY ARTERY BYPASS GRAFT  02/2010  . LEFT HEART CATH AND CORS/GRAFTS ANGIOGRAPHY N/A 06/03/2017   Procedure: Left Heart Cath and Cors/Grafts Angiography;  Surgeon: [name], MD;  Location: [name]  INVASIVE CV LAB;  Service: Cardiovascular;  Laterality: N/A;  . ROTATOR CUFF REPAIR  2009   right    Current Medications: Current Meds  Medication Sig  . amLODipine (NORVASC) 2.5 MG tablet Take 2.5 mg by mouth daily.  Marland Kitchen aspirin EC 81 MG tablet Take 1 tablet (81 mg total) by mouth daily. DO NOT START UNTIL FINISHED WITH 325 MG ASPIRIN  . atorvastatin (LIPITOR) 80 MG tablet Take 1 tablet (80 mg total) by mouth daily.  . carvedilol (COREG) 12.5 MG tablet Take 1 tablet (12.5 mg total) by mouth 2 (two) times daily with a meal.  . glimepiride (AMARYL) 4 MG tablet Take 6 mg by mouth daily with breakfast.  . losartan (COZAAR) 100 MG tablet TAKE 1 TABLET BY MOUTH DAILY  . Multiple Vitamins-Minerals (MULTIVITAMIN PO) Take 1 tablet by mouth daily.  . nitroGLYCERIN (NITROSTAT) 0.4 MG SL tablet Place 1 tablet (0.4 mg total) under the tongue every 5 (five) minutes as needed for chest pain.  Marland Kitchen clopidogrel (PLAVIX) 75 MG tablet TAKE 1 TABLET BY MOUTH DAILY WITH BREAKFAST. PLEASE KEEP UPCOMING APPT IN MAY 2022  . [DISCONTINUED] ibuprofen (ADVIL) 800 MG tablet Take 1 tablet (800 mg total) by mouth 3 (three) times daily.     Allergies:   Ace inhibitors, Imdur [isosorbide mononitrate], and Isosorbide nitrate   Social History   Socioeconomic History  . Marital  status: Married    Spouse name: Not on file  . Number of children: Not on file  . Years of education: Not on file  . Highest education level: Not on file  Occupational History  . Not on file  Tobacco Use  . Smoking status: Former Smoker    Types: Cigarettes    Quit date: 03/24/2017    Years since quitting: 4.1  . Smokeless tobacco: Never Used  Vaping Use  . Vaping Use: Never used  Substance and Sexual Activity  . Alcohol use: No  . Drug use: No  . Sexual activity: Yes  Other Topics Concern  . Not on file  Social History Narrative  . Not on file   Social Determinants of Health   Financial Resource Strain: Not on file  Food  Insecurity: Not on file  Transportation Needs: Not on file  Physical Activity: Not on file  Stress: Not on file  Social Connections: Not on file     Family History:  The patient's family history includes Diabetes in his mother; Heart attack in his mother; Hypertension in his father and mother.  ROS:   Please see the history of present illness.  All other systems are reviewed and otherwise negative.    EKGs/Labs/Other Studies Reviewed:    Studies reviewed are outlined and summarized above. Reports included below if pertinent.  2d echo 09/2017 - Left ventricle: The cavity size was normal. There was moderate  concentric hypertrophy. Systolic function was normal. The  estimated ejection fraction was in the range of 55% to 60%.  Hypokinesis of the midanteroseptal and anterior myocardium.  Features are consistent with a pseudonormal left ventricular  filling pattern, with concomitant abnormal relaxation and  increased filling pressure (grade 2 diastolic dysfunction).  - Left atrium: The atrium was mildly to moderately dilated.   Cath 05/2017   Ost RPDA to RPDA lesion, 30 %stenosed.    None ST elevation myocardial infarction due to occlusion of the SVG to the large obtuse marginal #2. The marginal beyond the graft has TIMI grade 1-2 flow. Likely completed infarct distal to the graft insertion site due to graft occlusion and thrombotic embolization.  Bypass graft failure with occlusion of the SVG to the obtuse marginal (acute), and occlusion of the SVG to the diagonal (chronic).  Patent LIMA to the LAD  Severe native vessel disease with 99% stenosis of obtuse marginal #2 with TIMI grade 2 flow beyond including the portion of the vessel distal to the graft insertion site. The remainder of the circumflex contains irregularities but is widely patent.  Ostial to proximal LAD stent with diffuse 50% narrowing. The large first diagonal receives antegrade flow via the natural  circulation and there is no evidence of significant obstruction.  Irregularities noted in the native right coronary. The vessel is widely patent.  Widely patent left main.  Left ventricular systolic dysfunction with estimated EF 35% and mildly elevated EDP.  RECOMMENDATIONS:  Medical therapy including long-acting nitrates, guideline based therapy for systolic heart failure, and aggressive risk factor modification.  If all medical therapy, angina is recurrent, he will need PCI of the native obtuse marginal #2. Not done today because it would jeopardize the more distal circumflex. With his late presentation and already elevated markers and no ongoing chest discomfort with TIMI 2 flow, we feel the circumflex territory infarction has completed itself.      EKG:  EKG is ordered today, personally reviewed, demonstrating NSR 69bpm, diffuse TWI I, avL, V3-V6 similar  to prior   Recent Labs: No results found for requested labs within last 8760 hours.  Recent Lipid Panel    Component Value Date/Time   CHOL 119 06/03/2017 0110   TRIG 195 (H) 06/03/2017 0110   HDL 23 (L) 06/03/2017 0110   CHOLHDL 5.2 06/03/2017 0110   VLDL 39 06/03/2017 0110   LDLCALC 57 06/03/2017 0110    PHYSICAL EXAM:    VS:  BP (!) 152/90   Pulse 69   Ht 5\' 8"  (1.727 m)   Wt 203 lb (92.1 kg)   SpO2 96%   BMI 30.87 kg/m   BMI: Body mass index is 30.87 kg/m.  GEN: Well nourished, well developed male in no acute distress HEENT: normocephalic, atraumatic Neck: no JVD, carotid bruits, or masses Cardiac: RRR; no murmurs, rubs, or gallops, no edema  Respiratory:  clear to auscultation bilaterally, normal work of breathing GI: soft, nontender, nondistended, + BS MS: no deformity or atrophy Skin: warm and dry, no rash Neuro:  Alert and Oriented x 3, Strength and sensation are intact, follows commands Psych: euthymic mood, full affect  Wt Readings from Last 3 Encounters:  05/02/21 203 lb (92.1 kg)  12/03/20 205  lb (93 kg)  11/25/19 210 lb (95.3 kg)     ASSESSMENT & PLAN:    1. CAD s/p CABG with HLD goal LDL <70 - clinically doing well without any angina. Continue ASA, Plavix, BB, statin. Most recent LDL at goal 08/2020 (checked at outside office). I will reach out to Dr. 09/2020 to ensure the plan is to continue long term DAPT for this patient. He is not having any difficulties on it. See below regarding HTN. Will update CBC, CMET today. ADDENDUM: per d/w Dr. [name], at this juncture he feels patient is OK to stop ASA and continue Plavix monotherapy. We'll relay this info to him in his lab result note.  2. Ischemic cardiomyopathy - last EF 09/2017 was normal again. He appears clinically euvolemic. Was unable to afford Entresto. Continue ARB. Continue losartan. Will focus on blood pressure control as below.  3. Essential HTN - Suboptimal blood pressure control noted today. He reports readings 130-140s at home, occasional 120s. Recheck by me 149/89. Will titrate amlodipine from 2.5mg  to 5mg  today. The patient was provided instructions on monitoring blood pressure at home for 1 week and relaying results to our office. I anticipate he may need further titration to 10mg  daily. Increasing carvedilol would be another option going forward if needed. Of note, HCTZ previously stopped due to dizziness. Update CMET and TSH today.  4. Diabetes mellitus without long term use of insulin - now on Jardiance. He believes his last A1C was actually down to the 7 range. We do not have this report. He plans to call his endocrinologist Dr. to arrange a follow-up visit.  Disposition: F/u with Dr. in 1 year.  Medication Adjustments/Labs and Tests Ordered: Current medicines are reviewed at length with the patient today.  Concerns regarding medicines are outlined above. Medication changes, Labs and Tests ordered today are summarized above and listed in the Patient Instructions accessible in Encounters.   Signed, 11-04-2005, PA-C  05/02/2021 3:17 PM    [facility] [address] [place], [place], [address]  [place] Phone: [phone]; Fax: [phone]

## 2021-05-02 ENCOUNTER — Other Ambulatory Visit: Payer: Self-pay

## 2021-05-02 ENCOUNTER — Encounter: Payer: Self-pay | Admitting: Physician Assistant

## 2021-05-02 ENCOUNTER — Ambulatory Visit: Payer: 59 | Admitting: Physician Assistant

## 2021-05-02 VITALS — BP 152/90 | HR 69 | Ht 68.0 in | Wt 203.0 lb

## 2021-05-02 DIAGNOSIS — I2510 Atherosclerotic heart disease of native coronary artery without angina pectoris: Secondary | ICD-10-CM

## 2021-05-02 DIAGNOSIS — I10 Essential (primary) hypertension: Secondary | ICD-10-CM | POA: Diagnosis not present

## 2021-05-02 DIAGNOSIS — E785 Hyperlipidemia, unspecified: Secondary | ICD-10-CM

## 2021-05-02 DIAGNOSIS — I255 Ischemic cardiomyopathy: Secondary | ICD-10-CM

## 2021-05-02 DIAGNOSIS — E118 Type 2 diabetes mellitus with unspecified complications: Secondary | ICD-10-CM

## 2021-05-02 MED ORDER — CLOPIDOGREL BISULFATE 75 MG PO TABS: ORAL_TABLET | ORAL | 3 refills | Status: DC

## 2021-05-02 MED ORDER — AMLODIPINE BESYLATE 5 MG PO TABS: 5.0000 mg | ORAL_TABLET | Freq: Every day | ORAL | 3 refills | Status: AC

## 2021-05-02 NOTE — Patient Instructions (Addendum)
Medication Instructions:  Your physician has recommended you make the following change in your medication:  1.  INCREASE the Amlodipine to 5 mg daily.  You may take 2 of the 2.5 mg tablets at a time, to use them up.    *If you need a refill on your cardiac medications before your next appointment, please call your pharmacy*   Lab Work: TODAY:  CMET, CBC, & TSH  If you have labs (blood work) drawn today and your tests are completely normal, you will receive your results only by: Marland Kitchen MyChart Message (if you have MyChart) OR . A paper copy in the mail If you have any lab test that is abnormal or we need to change your treatment, we will call you to review the results.   Testing/Procedures: None ordered   Follow-Up: At [facility], you and your health needs are our priority.  As part of our continuing mission to provide you with exceptional heart care, we have created designated Provider Care Teams.  These Care Teams include your primary Cardiologist (physician) and Advanced Practice Providers (APPs -  Physician Assistants and Nurse Practitioners) who all work together to provide you with the care you need, when you need it.  We recommend signing up for the patient portal called "MyChart".  Sign up information is provided on this After Visit Summary.  MyChart is used to connect with patients for Virtual Visits (Telemedicine).  Patients are able to view lab/test results, encounter notes, upcoming appointments, etc.  Non-urgent messages can be sent to your provider as well.   To learn more about what you can do with MyChart, go to [email or web address].    Your next appointment:   12 month(s)  The format for your next appointment:   In Person  Provider:   You may see [name], MD or one of the following Advanced Practice Providers on your designated Care Team:    Georgie Chard, NP    Other Instructions I would recommend using a blood pressure cuff that goes on your arm.  The wrist ones can be inaccurate. If possible, try to select one that also reports your heart rate. To check your blood pressure, choose a time at least 3 hours after taking your blood pressure medicines. If you can sample it at different times of the day, that's great - it might give you more information about how your blood pressure fluctuates. Remain seated in a chair for 5 minutes quietly beforehand, then check it. Please record a list of those readings and call us/send in MyChart message with them for our review 1 week.

## 2021-05-03 LAB — TSH: TSH: 1.05 u[IU]/mL (ref 0.450–4.500)

## 2021-05-03 LAB — COMPREHENSIVE METABOLIC PANEL
ALT: 34 IU/L (ref 0–44)
AST: 25 IU/L (ref 0–40)
Albumin/Globulin Ratio: 1.3 (ref 1.2–2.2)
Albumin: 4.2 g/dL (ref 3.8–4.9)
Alkaline Phosphatase: 81 IU/L (ref 44–121)
BUN/Creatinine Ratio: 15 (ref 9–20)
BUN: 18 mg/dL (ref 6–24)
Bilirubin Total: 0.2 mg/dL (ref 0.0–1.2)
CO2: 19 mmol/L — ABNORMAL LOW (ref 20–29)
Calcium: 8.8 mg/dL (ref 8.7–10.2)
Chloride: 109 mmol/L — ABNORMAL HIGH (ref 96–106)
Creatinine, Ser: 1.23 mg/dL (ref 0.76–1.27)
Globulin, Total: 3.3 g/dL (ref 1.5–4.5)
Glucose: 91 mg/dL (ref 65–99)
Potassium: 4.2 mmol/L (ref 3.5–5.2)
Sodium: 141 mmol/L (ref 134–144)
Total Protein: 7.5 g/dL (ref 6.0–8.5)
eGFR: 68 mL/min/{1.73_m2} (ref >59)

## 2021-05-03 LAB — CBC
Hematocrit: 39.3 % (ref 37.5–51.0)
Hemoglobin: 13.4 g/dL (ref 13.0–17.7)
MCH: 30.9 pg (ref 26.6–33.0)
MCHC: 34.1 g/dL (ref 31.5–35.7)
MCV: 91 fL (ref 79–97)
Platelets: 177 10*3/uL (ref 150–450)
RBC: 4.33 x10E6/uL (ref 4.14–5.80)
RDW: 12.9 % (ref 11.6–15.4)
WBC: 6.4 10*3/uL (ref 3.4–10.8)

## 2021-05-04 ENCOUNTER — Telehealth: Payer: Self-pay | Admitting: *Deleted

## 2021-05-04 NOTE — Telephone Encounter (Signed)
-----   Message from Laurann Montana, [place] sent at 05/04/2021  8:19 AM EDT ----- Please make sure the ASA gets discontinued off MAR since we are stopping this. Thx

## 2021-05-04 NOTE — Telephone Encounter (Signed)
Updated med list

## 2021-06-23 ENCOUNTER — Other Ambulatory Visit: Payer: Self-pay | Admitting: Interventional Cardiology

## 2021-06-23 DIAGNOSIS — I10 Essential (primary) hypertension: Secondary | ICD-10-CM

## 2022-04-23 ENCOUNTER — Other Ambulatory Visit: Payer: Self-pay | Admitting: Interventional Cardiology

## 2022-04-23 ENCOUNTER — Other Ambulatory Visit: Payer: Self-pay | Admitting: Physician Assistant

## 2022-05-02 ENCOUNTER — Telehealth: Payer: Self-pay | Admitting: Interventional Cardiology

## 2022-05-02 ENCOUNTER — Other Ambulatory Visit: Payer: Self-pay | Admitting: Interventional Cardiology

## 2022-05-02 MED ORDER — CLOPIDOGREL BISULFATE 75 MG PO TABS: ORAL_TABLET | ORAL | 0 refills | Status: DC

## 2022-05-02 NOTE — Telephone Encounter (Signed)
?*  STAT* If patient is at the pharmacy, call can be transferred to refill team. ? ? ?1. Which medications need to be refilled? (please list name of each medication and dose if known) clopidogrel (PLAVIX) 75 MG tablet ? ?2. Which pharmacy/location (including street and city if local pharmacy) is [facility] to [facility] sent to? [id number] [place], [place] ? ?3. Do they need a 30 day or 90 day supply?  90  ? ?Pt needs 90 days per insurance for it to be paid for. Pt made appt with Dr Tamala Julian on 06/30 ?  ?Pt is currently out of medication  ?

## 2022-05-02 NOTE — Telephone Encounter (Signed)
Pt's medication was sent to pt's pharmacy as requested. Confirmation received.  °

## 2022-06-19 NOTE — Progress Notes (Signed)
Cardiology Office Note:    Date:  06/22/2022   ID:  Rodney Gregory, DOB 02-Feb-1961, MRN [id number]  PCP:  [name], MD  Cardiologist:  [name], MD   Referring MD: [name], MD   Chief Complaint  Patient presents with   Coronary Artery Disease    History of Present Illness:    Rodney Gregory is a 61 y.o. male with a hx of CAD with bypass including LIMA to LAD, SVG to circumflex, and SVG to diagonal 2011, hyperlipidemia, hypertension, and diabetes mellitus. Non-ST elevation myocardial infarction June 2018 due to occlusion of the native diagonal of the LAD.   He is doing great.  He denies angina, claudication, dyspnea, neurological symptoms, medication side effects, and orthopnea.  He is getting greater than 150 minutes of moderate activity per week.  Past Medical History:  Diagnosis Date   Anemia    hx of blood loss anemia   CKD (chronic kidney disease), stage III (HCC)    Coronary artery disease     s/p CABG (LIMA to LAD, SVG to circumflex, and SVG to diagonal 2011), NSTEMI 05/2017 due to occlusion of native diagonal (managed medically)   Diabetes mellitus 12/18/2011   type 2   Diabetic nephropathy (HCC)    Hyperlipidemia    Hypertension    Ischemic cardiomyopathy    Myocardial infarction [facility])     Past Surgical History:  Procedure Laterality Date   BACK SURGERY     CORONARY ARTERY BYPASS GRAFT  02/2010   LEFT HEART CATH AND CORS/GRAFTS ANGIOGRAPHY N/A 06/03/2017   Procedure: Left Heart Cath and Cors/Grafts Angiography;  Surgeon: [name], MD;  [facility];  Service: Cardiovascular;  Laterality: N/A;   ROTATOR CUFF REPAIR  2009   right    Current Medications: Current Meds  Medication Sig   amLODipine (NORVASC) 5 MG tablet Take 1 tablet (5 mg total) by mouth daily.   atorvastatin (LIPITOR) 80 MG tablet Take 1 tablet (80 mg total) by mouth daily.   carvedilol (COREG) 12.5 MG tablet Take 1 tablet (12.5 mg total) by mouth 2  (two) times daily with a meal.   clopidogrel (PLAVIX) 75 MG tablet TAKE 1 TABLET BY MOUTH EVERY DAY WITH BREAKFAST   glimepiride (AMARYL) 4 MG tablet Take 6 mg by mouth daily with breakfast.   JARDIANCE 10 MG TABS tablet Take 10 mg by mouth daily.   losartan-hydrochlorothiazide (HYZAAR) 50-12.5 MG tablet Take 1 tablet by mouth daily.   Multiple Vitamins-Minerals (MULTIVITAMIN PO) Take 1 tablet by mouth daily.   [DISCONTINUED] losartan (COZAAR) 100 MG tablet TAKE 1 TABLET BY MOUTH EVERY DAY   [DISCONTINUED] nitroGLYCERIN (NITROSTAT) 0.4 MG SL tablet Place 1 tablet (0.4 mg total) under the tongue every 5 (five) minutes as needed for chest pain.     Allergies:   Ace inhibitors, Imdur [isosorbide mononitrate], and Isosorbide nitrate   Social History   Socioeconomic History   Marital status: Married    Spouse name: Not on file   Number of children: Not on file   Years of education: Not on file   Highest education level: Not on file  Occupational History   Not on file  Tobacco Use   Smoking status: Former    Types: Cigarettes    Quit date: 03/24/2017    Years since quitting: 5.2   Smokeless tobacco: Never  Vaping Use   Vaping Use: Never used  Substance and Sexual Activity  Alcohol use: No   Drug use: No   Sexual activity: Yes  Other Topics Concern   Not on file  Social History Narrative   Not on file   Social Determinants of Health   Financial Resource Strain: Not on file  Food Insecurity: Not on file  Transportation Needs: Not on file  Physical Activity: Not on file  Stress: Not on file  Social Connections: Not on file     Family History: The patient's family history includes Diabetes in his mother; Heart attack in his mother; Hypertension in his father and mother.  ROS:   Please see the history of present illness.  He is seeing Dr. Cleon Gustin because of elevated A1c.  Otherwise no new complaints.  All other systems reviewed and are negative.  EKGs/Labs/Other Studies  Reviewed:    The following studies were reviewed today: No new imaging  EKG:  EKG normal sinus rhythm with QS pattern V1 through V3 and lateral precordial T wave inversion.  When compared to the EKG done May 02, 2021 there are no substantial changes noted.  Recent Labs: No results found for requested labs within last 365 days.  Recent Lipid Panel    Component Value Date/Time   CHOL 119 06/03/2017 0110   TRIG 195 (H) 06/03/2017 0110   HDL 23 (L) 06/03/2017 0110   CHOLHDL 5.2 06/03/2017 0110   VLDL 39 06/03/2017 0110   LDLCALC 57 06/03/2017 0110    Physical Exam:    VS:  BP 116/80   Pulse 64   Ht 5\' 8"  (1.727 m)   Wt 191 lb (86.6 kg)   SpO2 95%   BMI 29.04 kg/m     Wt Readings from Last 3 Encounters:  06/22/22 191 lb (86.6 kg)  05/02/21 203 lb (92.1 kg)  12/03/20 205 lb (93 kg)     GEN: Healthy appearing. No acute distress HEENT: Normal NECK: No JVD. LYMPHATICS: No lymphadenopathy CARDIAC: No murmur. RRR S4 but no S3 gallop, or edema. VASCULAR:  Normal Pulses. No bruits. RESPIRATORY:  Clear to auscultation without rales, wheezing or rhonchi  ABDOMEN: Soft, non-tender, non-distended, No pulsatile mass, MUSCULOSKELETAL: No deformity  SKIN: Warm and dry NEUROLOGIC:  Alert and oriented x 3 PSYCHIATRIC:  Normal affect   ASSESSMENT:    1. CAD in native artery   2. Type 2 diabetes mellitus with complication, without long-term current use of insulin (HCC)   3. Essential hypertension   4. Ischemic cardiomyopathy   5. Hyperlipidemia LDL goal <70    PLAN:    In order of problems listed above:  Secondary prevention reviewed.  Encouraged him to continue to exceed 150 minutes of moderate activity per week.  This will help his diabetes, cardiovascular longevity, and and blood pressure. Seeing endocrinology. Excellent blood pressure control. No clinical evidence of heart failure. Lipid panel will be done next week by Dr. 14/11/21.  Overall education and awareness  concerning secondary risk prevention was discussed in detail: LDL less than 70, hemoglobin A1c less than 7, blood pressure target less than 130/80 mmHg, >150 minutes of moderate aerobic activity per week, avoidance of smoking, weight control (via diet and exercise), and continued surveillance/management of/for obstructive sleep apnea.    Medication Adjustments/Labs and Tests Ordered: Current medicines are reviewed at length with the patient today.  Concerns regarding medicines are outlined above.  Orders Placed This Encounter  Procedures   EKG 12-Lead   Meds ordered this encounter  Medications   nitroGLYCERIN (NITROSTAT) 0.4 MG SL  tablet    Sig: Place 1 tablet (0.4 mg total) under the tongue every 5 (five) minutes as needed for chest pain.    Dispense:  25 tablet    Refill:  3    Patient Instructions  Medication Instructions:  Your physician recommends that you continue on your current medications as directed. Please refer to the Current Medication list given to you today.  *If you need a refill on your cardiac medications before your next appointment, please call your pharmacy*  Lab Work: NONE  Testing/Procedures: NONE  Follow-Up: At [facility], you and your health needs are our priority.  As part of our continuing mission to provide you with exceptional heart care, we have created designated Provider Care Teams.  These Care Teams include your primary Cardiologist (physician) and Advanced Practice Providers (APPs -  Physician Assistants and Nurse Practitioners) who all work together to provide you with the care you need, when you need it.  Your next appointment:   1 year(s)  The format for your next appointment:   In Person  Provider:   [name], MD {   Important Information About Sugar         Signed, [name], MD  06/22/2022 8:45 AM    [facility]

## 2022-06-22 ENCOUNTER — Ambulatory Visit: Payer: 59 | Admitting: Interventional Cardiology

## 2022-06-22 ENCOUNTER — Encounter: Payer: Self-pay | Admitting: Interventional Cardiology

## 2022-06-22 VITALS — BP 116/80 | HR 64 | Ht 68.0 in | Wt 191.0 lb

## 2022-06-22 DIAGNOSIS — E118 Type 2 diabetes mellitus with unspecified complications: Secondary | ICD-10-CM

## 2022-06-22 DIAGNOSIS — I255 Ischemic cardiomyopathy: Secondary | ICD-10-CM | POA: Diagnosis not present

## 2022-06-22 DIAGNOSIS — E785 Hyperlipidemia, unspecified: Secondary | ICD-10-CM

## 2022-06-22 DIAGNOSIS — I2510 Atherosclerotic heart disease of native coronary artery without angina pectoris: Secondary | ICD-10-CM

## 2022-06-22 DIAGNOSIS — I10 Essential (primary) hypertension: Secondary | ICD-10-CM

## 2022-06-22 MED ORDER — NITROGLYCERIN 0.4 MG SL SUBL: 0.4000 mg | SUBLINGUAL_TABLET | SUBLINGUAL | 3 refills | Status: AC | PRN

## 2022-06-22 NOTE — Patient Instructions (Signed)

## 2022-07-29 ENCOUNTER — Other Ambulatory Visit: Payer: Self-pay | Admitting: Interventional Cardiology

## 2023-07-29 ENCOUNTER — Other Ambulatory Visit: Payer: Self-pay

## 2023-07-29 MED ORDER — CLOPIDOGREL BISULFATE 75 MG PO TABS: ORAL_TABLET | ORAL | 0 refills | Status: DC

## 2023-08-24 ENCOUNTER — Other Ambulatory Visit: Payer: Self-pay | Admitting: Physician Assistant

## 2023-09-18 ENCOUNTER — Other Ambulatory Visit: Payer: Self-pay | Admitting: Physician Assistant

## 2023-09-22 ENCOUNTER — Other Ambulatory Visit: Payer: Self-pay | Admitting: Physician Assistant

## 2023-09-28 ENCOUNTER — Other Ambulatory Visit: Payer: Self-pay | Admitting: Physician Assistant

## 2023-10-02 NOTE — Progress Notes (Unsigned)
No chief complaint on file.  History of Present Illness: 62 yo male with history of CKD stage 3, DM, HTN, HLD, CAD s/p 3V CABG, ischemic cardiomyopathy here today for cardiac follow up. He has been followed in our office by Dr. [name]. He had 3V CABG in 2011 (LIMA to LAD, SVG to Circumflex, SVG to Diagonal). He had a NSTEMI in 2018 secondary to occlusion of the SVG to the obtuse marginal branch. The SVG to the Diagonal branch was chronically occluded. Echo October 2018 with LVEF=55-60%. No valve disease.   He is here today for follow up. The patient denies any chest pain, dyspnea, palpitations, lower extremity edema, orthopnea, PND, dizziness, near syncope or syncope.   Primary Care Physician: [name], MD (Inactive)   Past Medical History:  Diagnosis Date   Anemia    hx of blood loss anemia   CKD (chronic kidney disease), stage III (HCC)    Coronary artery disease     s/p CABG (LIMA to LAD, SVG to circumflex, and SVG to diagonal 2011), NSTEMI 05/2017 due to occlusion of native diagonal (managed medically)   Diabetes mellitus 12/18/2011   type 2   Diabetic nephropathy (HCC)    Hyperlipidemia    Hypertension    Ischemic cardiomyopathy    Myocardial infarction [facility])     Past Surgical History:  Procedure Laterality Date   BACK SURGERY     CORONARY ARTERY BYPASS GRAFT  02/2010   LEFT HEART CATH AND CORS/GRAFTS ANGIOGRAPHY N/A 06/03/2017   Procedure: Left Heart Cath and Cors/Grafts Angiography;  Surgeon: [name], MD;  [facility]: [facility];  Service: Cardiovascular;  Laterality: N/A;   ROTATOR CUFF REPAIR  2009   right    Current Outpatient Medications  Medication Sig Dispense Refill   amLODipine (NORVASC) 5 MG tablet Take 1 tablet (5 mg total) by mouth daily. 90 tablet 3   atorvastatin (LIPITOR) 80 MG tablet Take 1 tablet (80 mg total) by mouth daily. 30 tablet 3   carvedilol (COREG) 12.5 MG tablet Take 1 tablet (12.5 mg total) by mouth 2 (two) times daily  with a meal. 180 tablet 1   clopidogrel (PLAVIX) 75 MG tablet TAKE 1 TABLET BY MOUTH EVERY DAY WITH BREAKFAST 90 tablet 0   glimepiride (AMARYL) 4 MG tablet Take 6 mg by mouth daily with breakfast.     JARDIANCE 10 MG TABS tablet Take 10 mg by mouth daily.     losartan-hydrochlorothiazide (HYZAAR) 50-12.5 MG tablet Take 1 tablet by mouth daily.     Multiple Vitamins-Minerals (MULTIVITAMIN PO) Take 1 tablet by mouth daily.     nitroGLYCERIN (NITROSTAT) 0.4 MG SL tablet Place 1 tablet (0.4 mg total) under the tongue every 5 (five) minutes as needed for chest pain. 25 tablet 3   No current facility-administered medications for this visit.    Allergies  Allergen Reactions   Ace Inhibitors Other (See Comments)    headaches   Imdur [Isosorbide Mononitrate] Other (See Comments)    Chest pain   Isosorbide Nitrate Other (See Comments)    headaches    Social History   Socioeconomic History   Marital status: Married    Spouse name: Not on file   Number of children: Not on file   Years of education: Not on file   Highest education level: Not on file  Occupational History   Not on file  Tobacco Use   Smoking status: Former    Current packs/day:  0.00    Types: Cigarettes    Quit date: 03/24/2017    Years since quitting: 6.5   Smokeless tobacco: Never  Vaping Use   Vaping status: Never Used  Substance and Sexual Activity   Alcohol use: No   Drug use: No   Sexual activity: Yes  Other Topics Concern   Not on file  Social History Narrative   Not on file   Social Determinants of Health   Financial Resource Strain: Not on file  Food Insecurity: Not on file  Transportation Needs: Not on file  Physical Activity: Not on file  Stress: Not on file  Social Connections: Not on file  Intimate Partner Violence: Not on file    Family History  Problem Relation Age of Onset   Heart attack Mother    Hypertension Mother    Diabetes Mother    Hypertension Father     Review of Systems:   As stated in the HPI and otherwise negative.   There were no vitals taken for this visit.  Physical Examination: General: Well developed, well nourished, NAD  HEENT: OP clear, mucus membranes moist  SKIN: warm, dry. No rashes. Neuro: No focal deficits  Musculoskeletal: Muscle strength 5/5 all ext  Psychiatric: Mood and affect normal  Neck: No JVD, no carotid bruits, no thyromegaly, no lymphadenopathy.  Lungs:Clear bilaterally, no wheezes, rhonci, crackles Cardiovascular: Regular rate and rhythm. No murmurs, gallops or rubs. Abdomen:Soft. Bowel sounds present. Non-tender.  Extremities: No lower extremity edema. Pulses are 2 + in the bilateral DP/PT.  EKG:  EKG {ACTION; IS/IS [id number]} ordered today. The ekg ordered today demonstrates ***  Recent Labs: No results found for requested labs within last 365 days.   Lipid Panel    Component Value Date/Time   CHOL 119 06/03/2017 0110   TRIG 195 (H) 06/03/2017 0110   HDL 23 (L) 06/03/2017 0110   CHOLHDL 5.2 06/03/2017 0110   VLDL 39 06/03/2017 0110   LDLCALC 57 06/03/2017 0110     Wt Readings from Last 3 Encounters:  06/22/22 86.6 kg  05/02/21 92.1 kg  12/03/20 93 kg    Assessment and Plan:   1. CAD s/p CABG without angina: He has no chest pain suggestive of angina. Will continue Plavix, statin, beta blocker.   2. Ischemic cardiomyopathy: LVEF=55% by echo in 2018. Will repeat echo now. Continue beta blocker, ARB and Jardiance.   3. HTN: BP is well controlled. No changes today  4. Hyperlipidemia: Lipids followed in primary care  Labs/ tests ordered today include:  No orders of the defined types were placed in this encounter.  Disposition:   F/U with me in one year    Signed, Verne Carrow, MD, [facility] 10/02/2023 12:33 PM    [facility] [address] [place], [place], [place]  [address] Phone: [phone]; Fax: [phone]

## 2023-10-03 ENCOUNTER — Ambulatory Visit: Payer: 59 | Attending: Cardiovascular Disease | Admitting: Cardiovascular Disease

## 2023-10-03 ENCOUNTER — Encounter: Payer: Self-pay | Admitting: Cardiovascular Disease

## 2023-10-03 VITALS — BP 116/70 | HR 69 | Ht 68.5 in | Wt 185.0 lb

## 2023-10-03 DIAGNOSIS — I2510 Atherosclerotic heart disease of native coronary artery without angina pectoris: Secondary | ICD-10-CM

## 2023-10-03 DIAGNOSIS — I255 Ischemic cardiomyopathy: Secondary | ICD-10-CM

## 2023-10-03 DIAGNOSIS — E785 Hyperlipidemia, unspecified: Secondary | ICD-10-CM

## 2023-10-03 DIAGNOSIS — I10 Essential (primary) hypertension: Secondary | ICD-10-CM

## 2023-10-03 MED ORDER — CLOPIDOGREL BISULFATE 75 MG PO TABS: ORAL_TABLET | ORAL | 3 refills | Status: DC

## 2023-10-03 NOTE — Patient Instructions (Signed)

## 2024-03-29 ENCOUNTER — Other Ambulatory Visit: Payer: Self-pay | Admitting: Physician Assistant

## 2024-06-03 ENCOUNTER — Telehealth: Payer: Self-pay

## 2024-06-03 NOTE — Telephone Encounter (Signed)
Left message to call back to set up tele pre op appt.  

## 2024-06-03 NOTE — Telephone Encounter (Signed)
...     Pre-operative Risk Assessment    Patient Name: Rodney Gregory  DOB: [birth year] MRN: [id number]   Date of last office visit: 10/03/23 Date of next office visit: none   Request for Surgical Clearance    Procedure:  colonoscopy  Date of Surgery:  Clearance 07/06/24                                Surgeon:  DR Oswaldo Blessing Surgeon's Group or Practice Name:  [facility] Phone number:  [phone] Fax number:  [phone]   Type of Clearance Requested:   - Medical  - Pharmacy:  Hold Clopidogrel  (Plavix ) 5 DAY HOLD   Type of Anesthesia:  PROPOFOL   Additional requests/questions:    Montel Antu   06/03/2024, 9:36 AM

## 2024-06-04 NOTE — Telephone Encounter (Signed)
2nd attempt to reach the pt to schedule a tele pre op appt.

## 2024-06-05 NOTE — Telephone Encounter (Signed)
 3rd attempt to reach the pt to schedule a tele preop appt. I will update the requesting office the pt needs to call our office [phone] and schedule tele preop appt.  I will remove from the preop call back pool at this time.

## 2024-06-08 ENCOUNTER — Telehealth: Payer: Self-pay

## 2024-06-08 NOTE — Telephone Encounter (Signed)
 Med Rec and Consent done      Patient Consent for Virtual Visit        Rodney Gregory has provided verbal consent on 06/08/2024 for a virtual visit (video or telephone).   CONSENT FOR VIRTUAL VISIT FOR:  Rodney Gregory  By participating in this virtual visit I agree to the following:  I hereby voluntarily request, consent and authorize [facility] and its employed or contracted physicians, physician assistants, nurse practitioners or other licensed health care professionals (the Practitioner), to provide me with telemedicine health care services (the "Services) as deemed necessary by the treating Practitioner. I acknowledge and consent to receive the Services by the Practitioner via telemedicine. I understand that the telemedicine visit will involve communicating with the Practitioner through live audiovisual communication technology and the disclosure of certain medical information by electronic transmission. I acknowledge that I have been given the opportunity to request an in-person assessment or other available alternative prior to the telemedicine visit and am voluntarily participating in the telemedicine visit.  I understand that I have the right to withhold or withdraw my consent to the use of telemedicine in the course of my care at any time, without affecting my right to future care or treatment, and that the Practitioner or I may terminate the telemedicine visit at any time. I understand that I have the right to inspect all information obtained and/or recorded in the course of the telemedicine visit and may receive copies of available information for a reasonable fee.  I understand that some of the potential risks of receiving the Services via telemedicine include:  Delay or interruption in medical evaluation due to technological equipment failure or disruption; Information transmitted may not be sufficient (e.g. poor resolution of images) to allow for appropriate medical decision  making by the Practitioner; and/or  In rare instances, security protocols could fail, causing a breach of personal health information.  Furthermore, I acknowledge that it is my responsibility to provide information about my medical history, conditions and care that is complete and accurate to the best of my ability. I acknowledge that Practitioner's advice, recommendations, and/or decision may be based on factors not within their control, such as incomplete or inaccurate data provided by me or distortions of diagnostic images or specimens that may result from electronic transmissions. I understand that the practice of medicine is not an exact science and that Practitioner makes no warranties or guarantees regarding treatment outcomes. I acknowledge that a copy of this consent can be made available to me via my patient portal [facility] MyChart), or I can request a printed copy by calling the office of [facility].    I understand that my insurance will be billed for this visit.   I have read or had this consent read to me. I understand the contents of this consent, which adequately explains the benefits and risks of the Services being provided via telemedicine.  I have been provided ample opportunity to ask questions regarding this consent and the Services and have had my questions answered to my satisfaction. I give my informed consent for the services to be provided through the use of telemedicine in my medical care

## 2024-06-08 NOTE — Telephone Encounter (Signed)
 Spoke with pt and scheduled TELE Preop appt 06/19/24.

## 2024-06-08 NOTE — Telephone Encounter (Signed)
 Med Rec and consent done

## 2024-06-08 NOTE — Telephone Encounter (Signed)
Patient returned Pre-op call. 

## 2024-06-19 ENCOUNTER — Ambulatory Visit: Payer: Self-pay | Attending: Cardiology

## 2024-06-19 DIAGNOSIS — Z01810 Encounter for preprocedural cardiovascular examination: Secondary | ICD-10-CM

## 2024-06-19 NOTE — Progress Notes (Signed)
 Virtual Visit via Telephone Note   Because of Rodney Gregory co-morbid illnesses, he is at least at moderate risk for complications without adequate follow up.  This format is felt to be most appropriate for this patient at this time.  Due to technical limitations with video connection (technology), today's appointment will be conducted as an audio only telehealth visit, and Rodney Gregory verbally agreed to proceed in this manner.   All issues noted in this document were discussed and addressed.  No physical exam could be performed with this format.  Evaluation Performed:  Preoperative cardiovascular risk assessment _____________   Date:  06/19/2024   Patient ID:  Rodney Gregory, DOB Feb 26, 1961, MRN [id number] Patient Location:  Home Provider location:   Office  Primary Care Provider:  Sheldon Netter, PA Primary Cardiologist:  Rodney Cash, MD  Chief Complaint / Patient Profile  63 y.o. y/o male with a h/o CKD stage III, DM, hypertension, HLD, CAD s/p three-vessel CABG in 2011, ischemic cardiomyopathy who is pending colonoscopy and presents today for telephonic preoperative cardiovascular risk assessment. History of Present Illness  Rodney Gregory is a 63 y.o. male who presents via audio/video conferencing for a telehealth visit today.  Pt was last seen in cardiology clinic on 10/03/2023 by Dr. Cash.  At that time Rodney Gregory was doing well.  The patient is now pending procedure as outlined above. Since his last visit, he has remained stable from a cardiac standpoint.  Patient is able to achieve greater than 4 METS of activity, he is regularly exercising 3 times a week for an hour to hour and a half, lifting weights, walking and running on his local track. Today denies chest pain, shortness of breath, lower extremity edema, fatigue, palpitations, melena, hematuria, hemoptysis, diaphoresis, weakness, presyncope, syncope, orthopnea, and PND.  Past Medical History    Past Medical  History:  Diagnosis Date   Anemia    hx of blood loss anemia   CKD (chronic kidney disease), stage III (HCC)    Coronary artery disease     s/p CABG (LIMA to LAD, SVG to circumflex, and SVG to diagonal 2011), NSTEMI 05/2017 due to occlusion of native diagonal (managed medically)   Diabetes mellitus 12/18/2011   type 2   Diabetic nephropathy (HCC)    Hyperlipidemia    Hypertension    Ischemic cardiomyopathy    Myocardial infarction [facility])    Past Surgical History:  Procedure Laterality Date   BACK SURGERY     CORONARY ARTERY BYPASS GRAFT  02/2010   LEFT HEART CATH AND CORS/GRAFTS ANGIOGRAPHY N/A 06/03/2017   Procedure: Left Heart Cath and Cors/Grafts Angiography;  Surgeon: Rodney Victory ORN, MD;  Location: [facility] INVASIVE CV [facility];  Service: Cardiovascular;  Laterality: N/A;   ROTATOR CUFF REPAIR  2009   right   Allergies Allergies  Allergen Reactions   Ace Inhibitors Other (See Comments)    headaches   Imdur  [Isosorbide  Mononitrate] Other (See Comments)    Chest pain   Isosorbide  Nitrate Other (See Comments)    headaches   Home Medications    Prior to Admission medications   Medication Sig Start Date End Date Taking? Authorizing Provider  amLODipine  (NORVASC ) 5 MG tablet Take 1 tablet (5 mg total) by mouth daily. 05/02/21 06/08/24  Dunn, Dayna N, PA-C  atorvastatin  (LIPITOR ) 80 MG tablet Take 1 tablet (80 mg total) by mouth daily. 06/05/17   Rodney Manuelita NOVAK, NP  carvedilol  (COREG ) 12.5 MG tablet Take 1  tablet (12.5 mg total) by mouth 2 (two) times daily with a meal. 04/17/18   Rodney Victory ORN, MD  clopidogrel  (PLAVIX ) 75 MG tablet TAKE 1 TABLET BY MOUTH EVERY DAY WITH BREAKFAST 03/30/24   Rodney Rodney BIRCH, MD  dorzolamide-timolol (COSOPT) 2-0.5 % ophthalmic solution Place 1 drop into the left eye 2 (two) times daily. 05/20/23   [provider]  glimepiride  (AMARYL ) 4 MG tablet Take 6 mg by mouth daily with breakfast.    [provider]  JARDIANCE 10 MG TABS tablet  Take 10 mg by mouth daily. 03/27/21   [provider]  losartan -hydrochlorothiazide (HYZAAR) 50-12.5 MG tablet Take 1 tablet by mouth daily. 06/18/22   [provider]  Multiple Vitamins-Minerals (MULTI FOR HIM) TABS daily.    [provider]  nitroGLYCERIN  (NITROSTAT ) 0.4 MG SL tablet Place 1 tablet (0.4 mg total) under the tongue every 5 (five) minutes as needed for chest pain. 06/22/22   Rodney Victory ORN, MD  ondansetron  (ZOFRAN ) 4 MG tablet Take by mouth as needed. 10/02/23   [provider]    Physical Exam  Vital Signs:  Rodney Gregory does not have vital signs available for review today. Given telephonic nature of communication, physical exam is limited. AAOx3. NAD. Normal affect.  Speech and respirations are unlabored. Accessory Clinical Findings  None Assessment & Plan  1.  Preoperative Cardiovascular Risk Assessment: Rodney Gregory perioperative risk of a major cardiac event is 6.6% according to the Revised Cardiac Risk Index (RCRI).  His functional capacity is excellent, he is able to achieve greater than 4 METS of activity.  Recommendations: According to ACC/AHA guidelines, no further cardiovascular testing needed.  The patient may proceed to surgery at acceptable risk.   Antiplatelet and/or Anticoagulation Recommendations: Per office protocol, OK to hold plavix  for 5 days prior to procedure. Plavix  should be resumed as soon as possible after the procedure. He should take ASA 81 mg daily while off Plavix , aspirin  can be discontinued when Plavix  is resumed.    The patient was advised that if he develops new symptoms prior to surgery to contact our office to arrange for a follow-up visit, and he verbalized understanding.  A copy of this note will be routed to requesting surgeon.  Time:   Today, I have spent 11 minutes with the patient with telehealth technology discussing medical history, symptoms, and management plan.    Rodney Corey D Osei Anger, NP  06/19/2024,  5:32 PM

## 2024-12-08 ENCOUNTER — Telehealth (HOSPITAL_BASED_OUTPATIENT_CLINIC_OR_DEPARTMENT_OTHER): Payer: Self-pay | Admitting: *Deleted

## 2024-12-08 NOTE — Telephone Encounter (Signed)
° °  Pre-operative Risk Assessment    Patient Name: Rodney Gregory  DOB: 24-Feb-1961 MRN: [id number]   Date of last office visit: 10/03/23 DR. MCALHANY Date of next office visit: NONE   Request for Surgical Clearance    Procedure:  LEFT TOTAL KNEE ARTHROPLASTY  Date of Surgery:  Clearance TBD                                Surgeon:  DR. DANIEL MARCHWIANY  Surgeon's Group or Practice Name:  [facility] Phone number:  [phone] ATTN:KELLY HIGH Fax number:  [phone]   Type of Clearance Requested:   - Medical  - Pharmacy:  Hold Clopidogrel  (Plavix )     Type of Anesthesia:  Spinal   Additional requests/questions:    [name]   12/08/2024, 4:56 PM

## 2024-12-09 ENCOUNTER — Ambulatory Visit: Attending: Physician Assistant | Admitting: Physician Assistant

## 2024-12-09 ENCOUNTER — Encounter: Payer: Self-pay | Admitting: Physician Assistant

## 2024-12-09 VITALS — BP 114/74 | HR 70 | Ht 68.5 in | Wt 199.0 lb

## 2024-12-09 DIAGNOSIS — E785 Hyperlipidemia, unspecified: Secondary | ICD-10-CM | POA: Diagnosis not present

## 2024-12-09 DIAGNOSIS — I255 Ischemic cardiomyopathy: Secondary | ICD-10-CM

## 2024-12-09 DIAGNOSIS — Z01810 Encounter for preprocedural cardiovascular examination: Secondary | ICD-10-CM | POA: Diagnosis not present

## 2024-12-09 DIAGNOSIS — I10 Essential (primary) hypertension: Secondary | ICD-10-CM

## 2024-12-09 DIAGNOSIS — I2510 Atherosclerotic heart disease of native coronary artery without angina pectoris: Secondary | ICD-10-CM

## 2024-12-09 NOTE — Telephone Encounter (Signed)
 1st attempt to reach pt regarding surgical clearance and the need for an IN OFFICE appointment.  Left pt a detailed message to call back and get that scheduled.

## 2024-12-09 NOTE — Telephone Encounter (Signed)
 MD reply noted, await scheduling below. Will remove from preop APP box.

## 2024-12-09 NOTE — Assessment & Plan Note (Signed)
 Hypertension well-controlled with current medication regimen. - Continue amlodipine  5 mg daily - Continue carvedilol  12.5 mg twice daily - Continue losartan  HCTZ 50/12.5 mg daily

## 2024-12-09 NOTE — Patient Instructions (Signed)
 Medication Instructions:  OK TO HOLD PLAVIX  5 DAYS PRIOR TO SURGERY AND OK TO RESUME ONCE YOU HAVE BEEN CLEARED BY THE SURGEON *If you need a refill on your cardiac medications before your next appointment, please call your pharmacy*  Lab Work: At your convenience- FASTING LIPIDS If you have labs (blood work) drawn today and your tests are completely normal, you will receive your results only by: MyChart Message (if you have MyChart) OR A paper copy in the mail If you have any lab test that is abnormal or we need to change your treatment, we will call you to review the results.  Testing/Procedures: None Ordered  Follow-Up: At [facility], you and your health needs are our priority.  As part of our continuing mission to provide you with exceptional heart care, our providers are all part of one team.  This team includes your primary Cardiologist (physician) and Advanced Practice Providers or APPs (Physician Assistants and Nurse Practitioners) who all work together to provide you with the care you need, when you need it.  Your next appointment:   12 month(s)  Provider:   Lonni Cash, MD    We recommend signing up for the patient portal called MyChart.  Sign up information is provided on this After Visit Summary.  MyChart is used to connect with patients for Virtual Visits (Telemedicine).  Patients are able to view lab/test results, encounter notes, upcoming appointments, etc.  Non-urgent messages can be sent to your provider as well.   To learn more about what you can do with MyChart, go to [email or web address].   Other Instructions

## 2024-12-09 NOTE — Telephone Encounter (Signed)
° °  Name: Rodney Gregory  DOB: 1961-10-07  MRN: [id number]  Primary Cardiologist: Lonni Cash, MD  Chart reviewed as part of pre-operative protocol coverage. Because of JAGER KOSKA past medical history and time since last visit, he will require a follow-up in-office visit in order to better assess preoperative cardiovascular risk. Last OV >1 year ago.  Pre-op covering staff: - Please schedule appointment and call patient to inform them. If patient already had an upcoming appointment within acceptable timeframe, please add pre-op clearance to the appointment notes so provider is aware. - Please contact requesting surgeon's office via preferred method (i.e, phone, fax) to inform them of need for appointment prior to surgery.  This message will also be routed to Dr. Cash on holding Plavix  as requested below so that this information is available to the clearing provider at time of patient's appointment. (Patient with hx of CABG and subsequent graft occlusion so falls outside DAPT algorithm - on Plavix  monotherapy but would not be a candidate to sub out aspirin  given planned spinal anesthesia.) Dr. Cash - Please route response to P CV DIV PREOP (the pre-op pool). Thank you.   Zell Doucette N Aryka Coonradt, PA-C  12/09/2024, 10:15 AM

## 2024-12-09 NOTE — Progress Notes (Signed)
 OFFICE NOTE:    Date:  12/09/2024  ID:  Rodney Gregory, DOB June 29, 1961, MRN [id number] PCP: Sheldon Netter, PA  [facility] Providers Cardiologist:  Lonni Cash, MD        Coronary artery disease  S/p CABG in 2011 MPI 02/27/16: infarct w peri-infarct ischemia NSTEMI 05/2017 - Med Rx  LHC 06/03/17: L-LAD patent, S-OM2 100 (culprit), S-Dx 100 CTO; OM2 99, pLAD 50, RCA irregs, EF 35, elevated LVEDP Ischemic CM TTE 04/2010: Mod LVH, EF 60-65%, inf and apical severe HK, ant-septal mod HK, Gr 1 DD  EF 35 on LHC in 05/2017 TTE 10/03/17: mod conc LVH, EF 55-60, ant-sept and ant HK, Gr 2 DD, mild to mod LAE Hypertension  Hyperlipidemia  Diabetes mellitus type 2 Chronic kidney disease stage 3         Discussed the use of AI scribe software for clinical note transcription with the patient, who gave verbal consent to proceed. History of Present Illness Rodney Gregory is a 63 y.o. male for follow up of CAD. He is a prior pt of Dr. Claudene. He established with Dr. Cash and was last seen in 09/2023.   He needs surgical clearance for a left total knee replacement. He reports no chest discomfort, shortness of breath, or syncope. 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 He has not worked since early October due to knee pain but can still perform household activities with some limitations. He can manage stairs at home by taking his time.    ROS-See HPI    Studies Reviewed:  EKG Interpretation Date/Time:  Wednesday December 09 2024 15:58:32 EST Ventricular Rate:  71 PR Interval:  136 QRS Duration:  80 QT Interval:  384 QTC Calculation: 417 R Axis:   33  Text Interpretation: Sinus rhythm with Premature atrial complexes Possible Left atrial enlargement Septal infarct (cited on or before 03-Oct-2023) ST & T wave abnormality, consider anterolateral ischemia No significant change since last tracing Confirmed by Lelon Hamilton  [phone]) on 12/09/2024 4:14:37 PM    Results Labs Creatinine (11/07/2024): 1.54 GFR (11/07/2024): 51 Potassium (11/07/2024): 4.3 ALT (11/07/2024): 27 Total cholesterol (11/07/2024): 133 Triglycerides (11/07/2024): 79 HDL (11/07/2024): 35 LDL (88/84/7974): 82 Microalbumin to creatinine ratio (11/07/2024): 99 Hemoglobin A1c (11/07/2024): 8.7         Physical Exam:  VS:  BP 114/74   Pulse 70   Ht 5' 8.5 (1.74 m)   Wt 199 lb (90.3 kg)   SpO2 95%   BMI 29.82 kg/m        Wt Readings from Last 3 Encounters:  12/09/24 199 lb (90.3 kg)  10/03/23 185 lb (83.9 kg)  06/22/22 191 lb (86.6 kg)    Constitutional:      Appearance: Healthy appearance. Not in distress.  Neck:     Vascular: JVD normal.  Pulmonary:     Breath sounds: Normal breath sounds. No wheezing. No rales.  Cardiovascular:     Normal rate. Regular rhythm.     Murmurs: There is no murmur.  Edema:    Peripheral edema absent.  Abdominal:     Palpations: Abdomen is soft.       Assessment and Plan:    Assessment & Plan Preop cardiovascular exam Mr. Coia has a hx of prior MI, CABG. He does not currently have any unstable cardiac conditions. His perioperative risk of a major cardiac event is low at 0.9% according to  the Revised Cardiac Risk Index (RCRI).  His functional capacity is good at 4.73 METs according to the [employer] Activity Status Index (DASI). His history was already reviewed by Dr. Verlin who noted his Clopidogrel  can be held for his upcoming surgery.  - According to ACC/AHA guidelines, no further cardiovascular testing needed.  The patient may proceed to surgery at acceptable risk.   - Clopidogrel  (Plavix ) can be held for 5 days prior to his surgery and resumed as soon as possible post op. Coronary artery disease involving native coronary artery of native heart without angina pectoris Coronary artery disease with CABG in 2011. Non-STEMI in June 2018 due to occlusion of vein graft to OM2. Chronically  occluded vein graft to diagonal. LIMA to LAD patent. No current angina symptoms. - Continue Lipitor  80 mg daily - Continue Plavix  75 mg daily Hyperlipidemia LDL goal <70 LDL cholesterol above goal at 82 mg/dL. Previous LDL was 57 mg/dL in 7981.   - Continue Lipitor  80 mg daily - Will repeat fasting Lipids in six months - Will consider adding ezetimibe if LDL remains above 70 mg/dL Primary hypertension Hypertension well-controlled with current medication regimen. - Continue amlodipine  5 mg daily - Continue carvedilol  12.5 mg twice daily - Continue losartan  HCTZ 50/12.5 mg daily Ischemic cardiomyopathy Ischemic cardiomyopathy with EF previously 35% during myocardial infarction in 2018, improved to 55-60% on repeat echocardiogram. Anteroapical and anterior hypokinesis noted. Volume status currently stable. NYHA class I. - Continue Jardiance 10 mg daily - Continue losartan  HCTZ 50/12.5 mg daily - Continue carvedilol  12.5 mg twice daily        Dispo:  Return in about 1 year (around 12/09/2025) for Routine Follow Up, w/ Dr. Verlin.  Signed, Glendia Ferrier, PA-C

## 2024-12-10 ENCOUNTER — Telehealth: Payer: Self-pay

## 2024-12-10 NOTE — Telephone Encounter (Signed)
 Called pt who informed me he had clearance appt with Glendia Ferrier, PA yesterday. Pt has been cleared and will be removed from pool. Thank you
